# Patient Record
Sex: Male | Born: 1990 | Race: White | Hispanic: No | Marital: Single | Smoking: Current every day smoker
Health system: Southern US, Community
[De-identification: ages and names within clinical notes are randomized; demographics above are authoritative.]

## PROBLEM LIST (undated history)

## (undated) DIAGNOSIS — K469 Unspecified abdominal hernia without obstruction or gangrene: Secondary | ICD-10-CM

## (undated) HISTORY — DX: Unspecified abdominal hernia without obstruction or gangrene: K46.9

## (undated) HISTORY — PX: HERNIA REPAIR: SHX51

---
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
## 2012-04-24 ENCOUNTER — Emergency Department: Payer: Self-pay | Admitting: Emergency Medicine

## 2013-03-25 ENCOUNTER — Ambulatory Visit: Payer: Self-pay | Admitting: Family Medicine

## 2013-03-25 LAB — CBC WITH DIFFERENTIAL/PLATELET
Eosinophil %: 3.5 %
Monocyte #: 0.8 x10 3/mm (ref 0.2–1.0)
Neutrophil %: 66.3 %
Platelet: 190 10*3/uL (ref 150–440)
WBC: 11.8 10*3/uL — ABNORMAL HIGH (ref 3.8–10.6)

## 2013-03-26 ENCOUNTER — Other Ambulatory Visit: Payer: Self-pay | Admitting: Family Medicine

## 2013-03-26 LAB — CBC WITH DIFFERENTIAL/PLATELET
Basophil %: 0.6 %
Eosinophil #: 0.7 10*3/uL (ref 0.0–0.7)
Eosinophil %: 5.5 %
HGB: 17 g/dL (ref 13.0–18.0)
Lymphocyte #: 3 10*3/uL (ref 1.0–3.6)
Lymphocyte %: 25.4 %
MCV: 88 fL (ref 80–100)
Monocyte #: 0.8 x10 3/mm (ref 0.2–1.0)
WBC: 11.8 10*3/uL — ABNORMAL HIGH (ref 3.8–10.6)

## 2013-03-29 ENCOUNTER — Encounter: Payer: Self-pay | Admitting: General Surgery

## 2013-03-29 ENCOUNTER — Ambulatory Visit (INDEPENDENT_AMBULATORY_CARE_PROVIDER_SITE_OTHER): Payer: Managed Care, Other (non HMO) | Admitting: General Surgery

## 2013-03-29 VITALS — BP 144/86 | HR 74 | Resp 14 | Ht 72.0 in | Wt 249.0 lb

## 2013-03-29 DIAGNOSIS — R109 Unspecified abdominal pain: Secondary | ICD-10-CM

## 2013-03-29 NOTE — Patient Instructions (Signed)
Patient to return as needed. 

## 2013-03-29 NOTE — Progress Notes (Signed)
Patient ID: Edward Munoz, male   DOB: [birth year], 22 y.o.   MRN: [id number]  Chief Complaint  Patient presents with  . Abdominal Pain    HPI Edward Munoz is a 22 y.o. male.  Here today for evaluation of abdominal pain referred by d Huston Foley NP. CT scan and labs done. States the pain started last week. The pain is constant. The pain is described as a sharp stabbing pain at times as well as a dull ache at time. He notices a bulge in the left side of his abdomen. He noticed the bulge at the same time the pain started.  The patient reported some discomfort superior and lateral to the umbilicus, on the left side of the abdomen. He noticed thickening and the skin and tenderness to pressure. No history of trauma or unusual physical activity. No difficulty with appetite, bowel or bladder function  HPI  Past Medical History  Diagnosis Date  . Hernia     Past Surgical History  Procedure Laterality Date  . Hernia repair      4th grade inguinal    Family History  Problem Relation Age of Onset  . Cancer Mother     breast  diagnosed in 80  . Cancer Maternal Aunt     colon    Social History History  Substance Use Topics  . Smoking status: Current Every Day Smoker -- 0.25 packs/day for 1 years    Types: Cigarettes  . Smokeless tobacco: Not on file  . Alcohol Use: Yes    Allergies  Allergen Reactions  . Codeine Hives  . Penicillins Hives    Current Outpatient Prescriptions  Medication Sig Dispense Refill  . doxycycline (VIBRA-TABS) 100 MG tablet 100 mg 2 (two) times daily.       Marland Kitchen HYDROcodone-homatropine (HYCODAN) 5-1.5 MG/5ML syrup 5 mLs every 6 (six) hours as needed.        No current facility-administered medications for this visit.    Review of Systems Review of Systems  Constitutional: Negative.   Respiratory: Negative.   Cardiovascular: Negative.   Gastrointestinal: Positive for abdominal pain.    Blood pressure 144/86, pulse 74, resp. rate 14, height 6' (1.829 m),  weight 249 lb (112.946 kg).  Physical Exam Physical Exam  Constitutional: He is oriented to person, place, and time. He appears well-developed and well-nourished.  Cardiovascular: Normal rate, regular rhythm and normal heart sounds.   No murmur heard. Pulmonary/Chest: Effort normal and breath sounds normal.  Abdominal: Soft. Normal appearance and bowel sounds are normal.    4 cm area to the left superior to the umbilicus tender, faint erythema without distinct mass.   Neurological: He is alert and oriented to person, place, and time.  Skin: Skin is warm and dry.    Data Reviewed CT of the abdomen without contrast dated 03/25/2013 was reviewed. Mild thickening of the skin to the left of the umbilicus was thought to possibly represent inflammation. Fingerlike projection of peritoneal fat at the umbilical area. No other abnormalities of the subcutaneous fat. No evidence of fascial abnormality except the small defect related to the umbilicus.  CBC dated 03/25/2013 showed a hemoglobin of 18.1, blood cell count 11,800 with a normal differential. Platelet count 190,000.  Assessment    Local inflammation in the subcutaneous fat without evidence of abscess formation or significant umbilical hernia     Plan    Observation alone is warranted at this time. No indication for repair of this 5 mm fascial defect which  is nontender not clinically palpable.       Earline Mayotte 03/30/2013, 7:57 AM

## 2013-03-30 ENCOUNTER — Encounter: Payer: Self-pay | Admitting: General Surgery

## 2013-12-07 ENCOUNTER — Ambulatory Visit: Payer: Self-pay | Admitting: Family Medicine

## 2014-03-20 IMAGING — CT CT ABDOMEN W/O CM
1 of 2 series · 15 of 32 positions shown, 19 images · non-contrast
Comparison: none

REASON FOR EXAM: abd pain
COMMENTS:

[Series 2: 3mm soft tissue · axial · 0.85mm/px · z∈[-883,-568]mm · 15 of 115 slices shown, 19 images]
[im 5/115  soft-tissue]
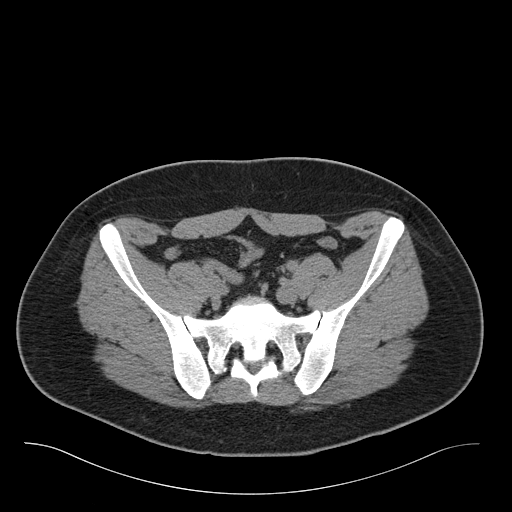
[im 5/115  bone]
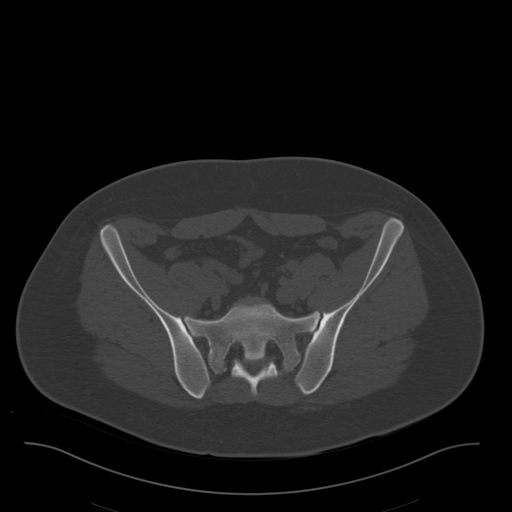
[im 15/115  soft-tissue]
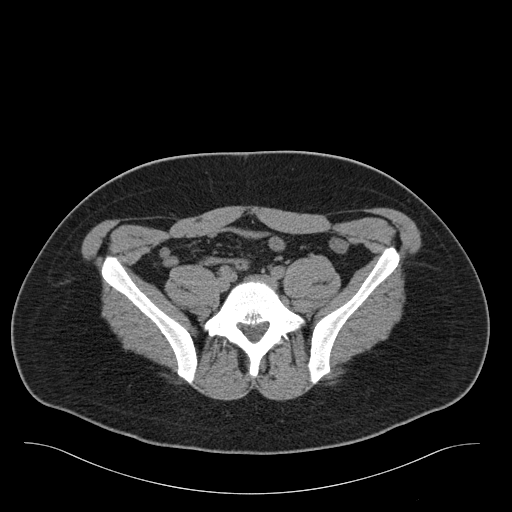
[im 24/115  soft-tissue]
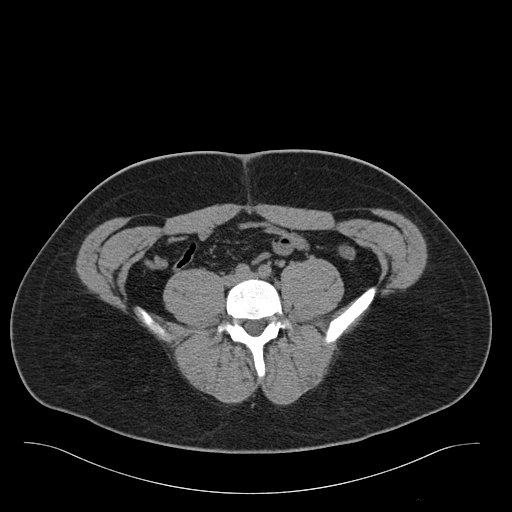
[im 34/115  soft-tissue]
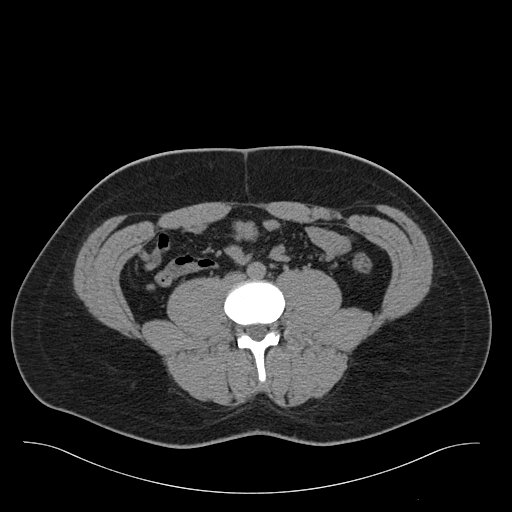
[im 39/115  soft-tissue]
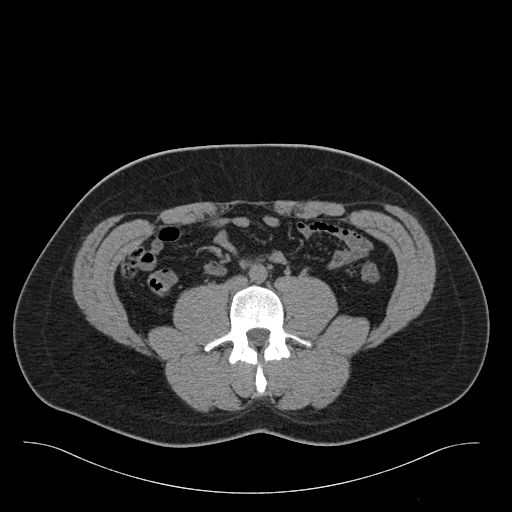
[im 48/115  soft-tissue]
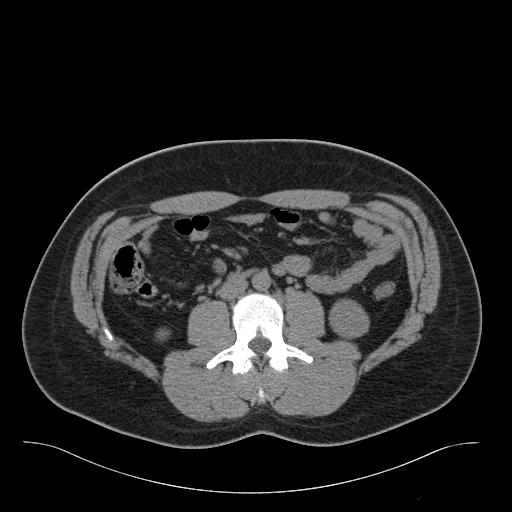
[im 58/115  soft-tissue]
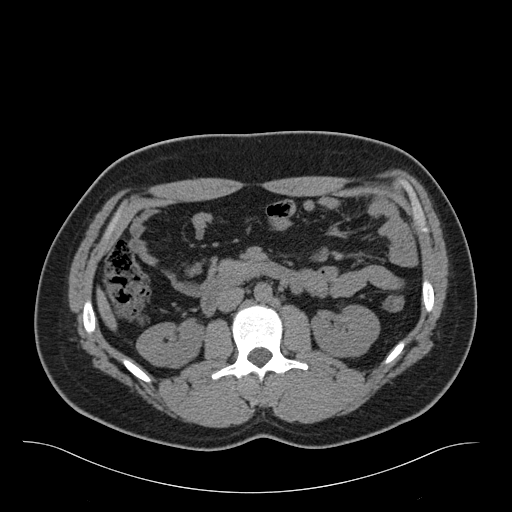
[im 67/115  soft-tissue]
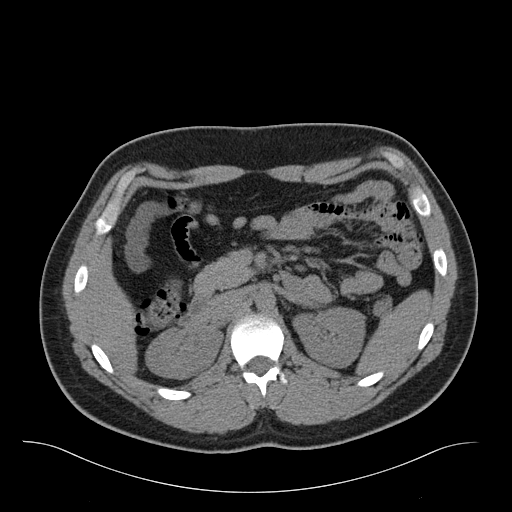
[im 77/115  soft-tissue]
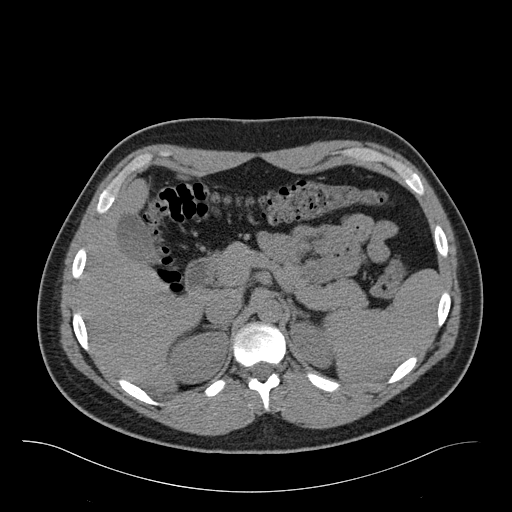
[im 77/115  bone]
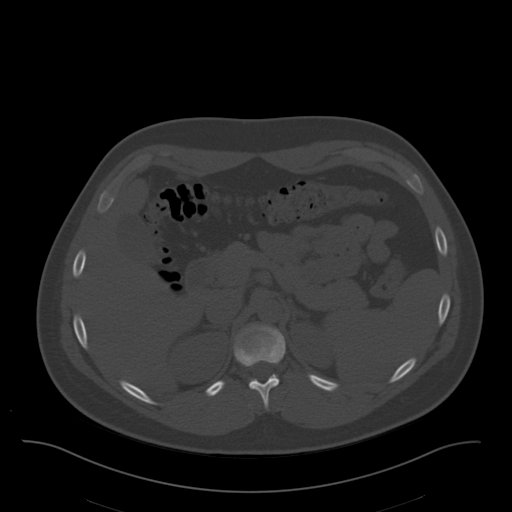
[im 81/115  soft-tissue]
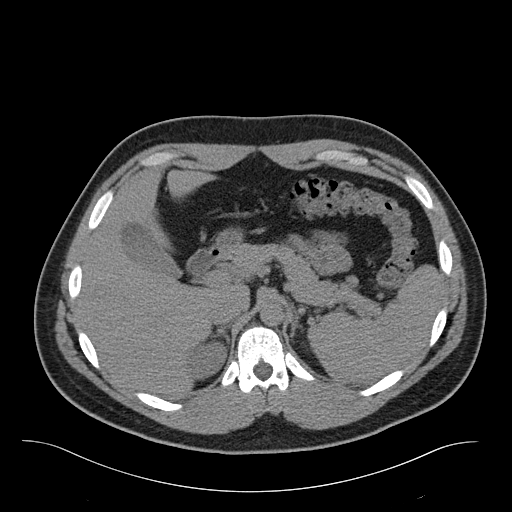
[im 91/115  soft-tissue]
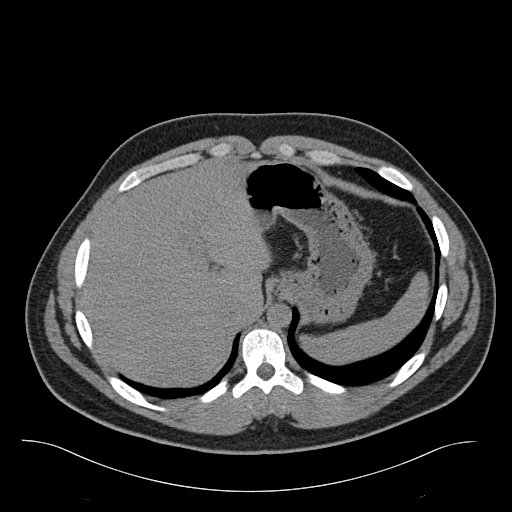
[im 96/115  lung]
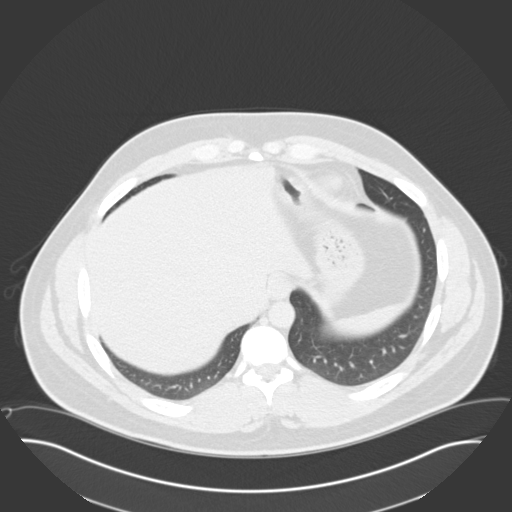
[im 100/115  soft-tissue]
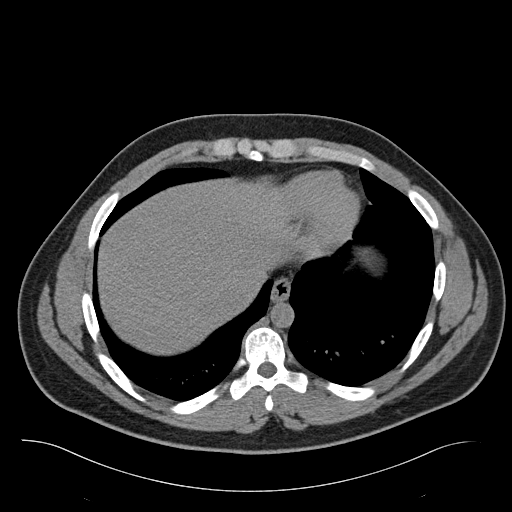
[im 100/115  lung]
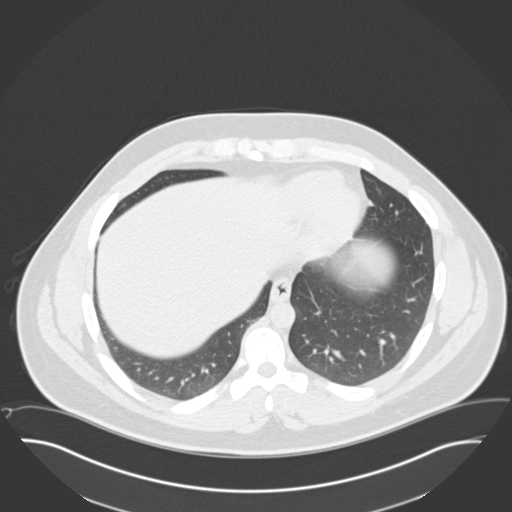
[im 105/115  lung]
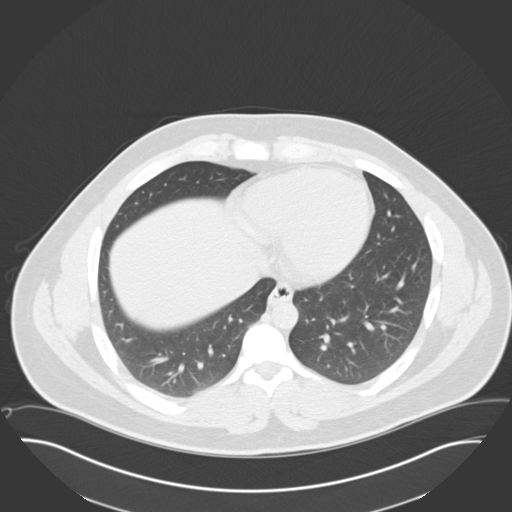
[im 110/115  soft-tissue]
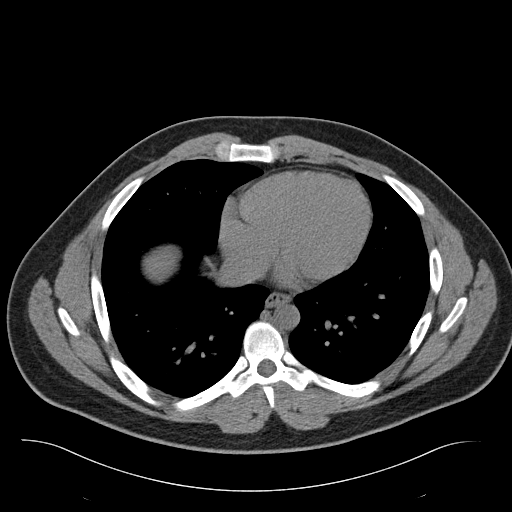
[im 110/115  lung]
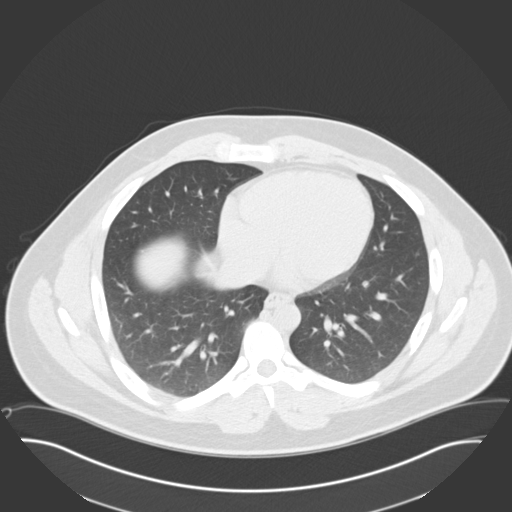

[15 of 32 positions shown; findings below may reference images not displayed]

PROCEDURE:     CT  - CT ABDOMEN STANDARD WO  - March 25, 2013  [DATE]

RESULT:     Axial noncontrast CT scanning was performed through the abdomen
with reconstructions at 3 mm intervals and slice thicknesses. Review of
multiplanar reconstructed images was performed separately on the VIA monitor.

The at umbilicus appears normal. There is no significant hernia
demonstrated. A tiny obtained are likely injection of peritoneal fat extends
to just the to the skin surface. There is mild induration of the soft
tissues of the umbilicus. There is no involvement of bowel or other
peritoneal structures.

Liver, gallbladder, spleen, adrenal glands, and kidneys are normal in
appearance. The stomach and observed portions of the small and large bowel
exhibit no acute abnormalities. There are numerous normal sized mesenteric
lymph nodes present.

The lung bases are clear. The lumbar vertebral bodies are preserved in
height.
IMPRESSION: 1. Mild thickening associated with the left aspect of the skin of the
umbilicus may reflect inflammation. There is a fingerlike projection of
peritoneal fat into the umbilicus coming to lie approximately 5 mm from the
skin surface deep in the umbilicus. There is no evidence of herniation of
bowel into the umbilicus.
2. The subcutaneous fat elsewhere in the anterior abdominal wall appears
normal.
3. No acute intra-abdominal abnormality is demonstrated.

Common the findings were discussed with this Tayser Masrawy, NP, at [DATE] p.m.
on March 25, 2013.

[REDACTED]

## 2014-12-02 IMAGING — CR DG ABDOMEN 1V
1 series · 3 of 3 positions shown · non-contrast
Comparison: None.

CLINICAL DATA: Abdominal pain.

EXAM:
ABDOMEN - 1 VIEW

[Series 1: supine kub · 0.17mm/px · 3 of 3 slices shown]
[im 1/3]
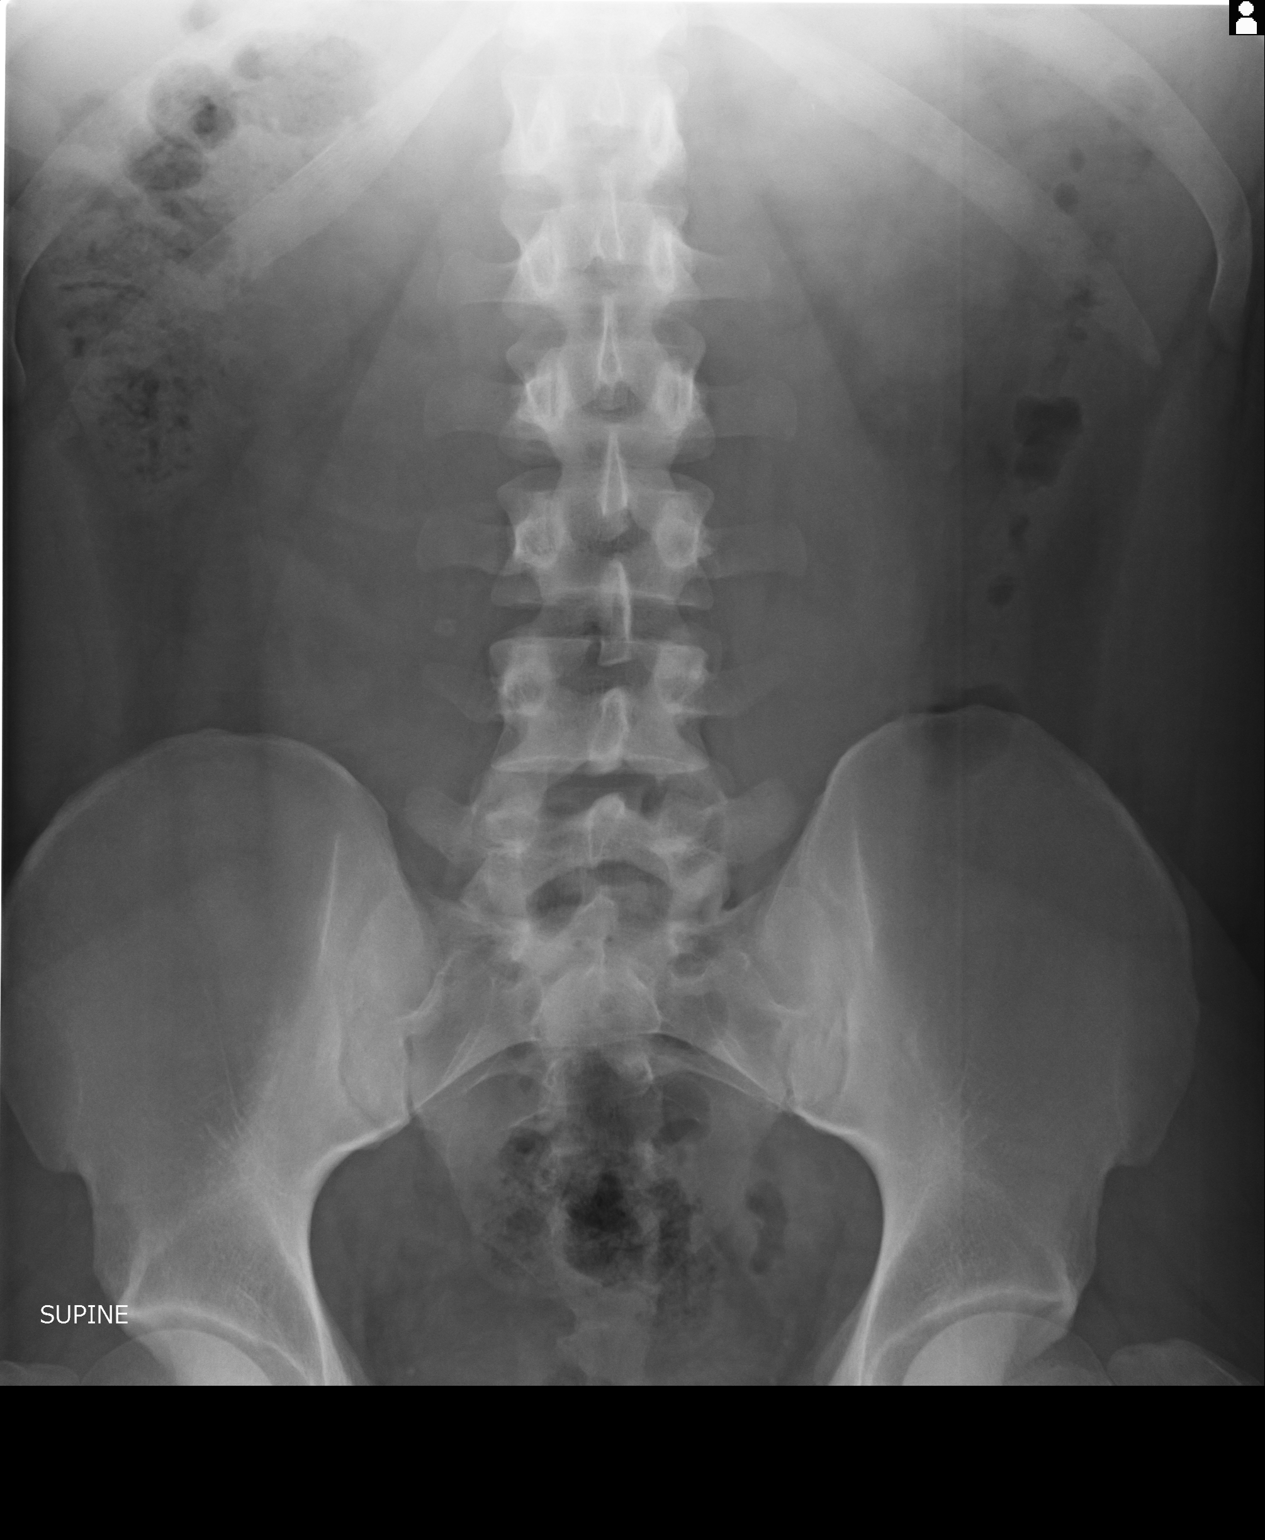
[im 2/3]
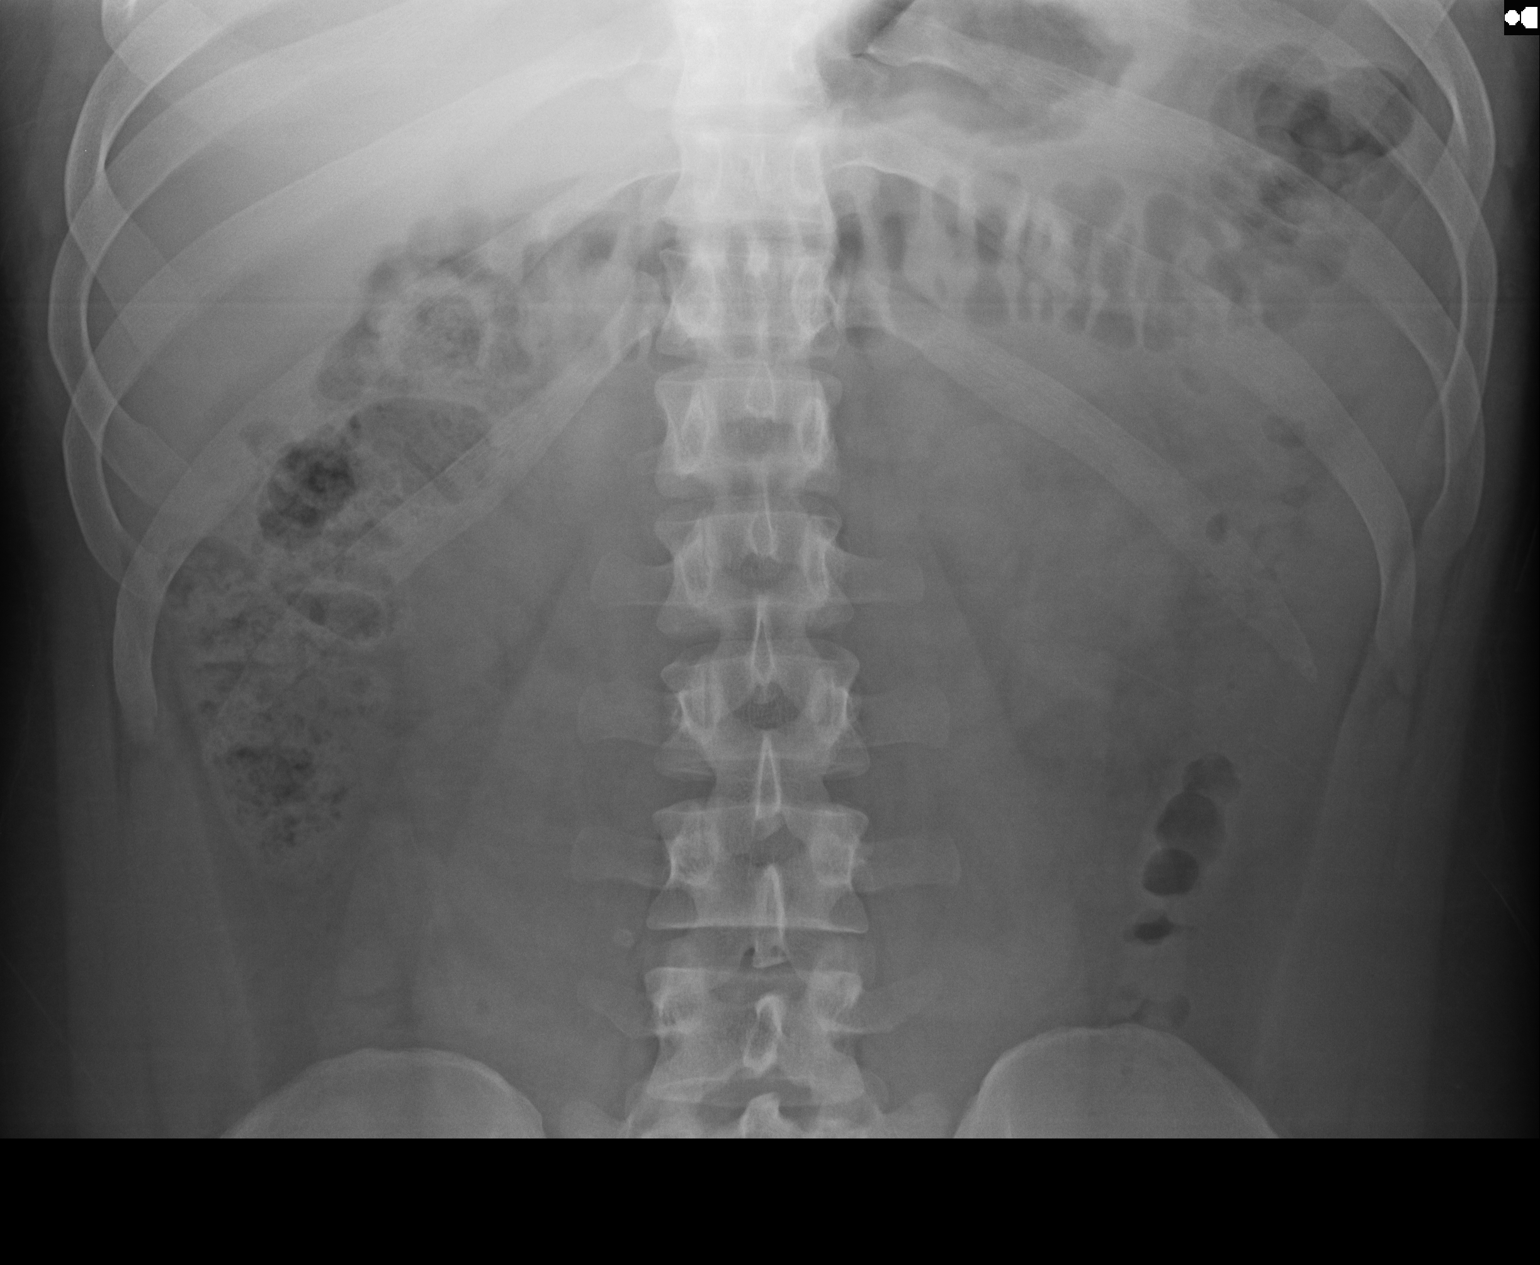
[im 3/3]
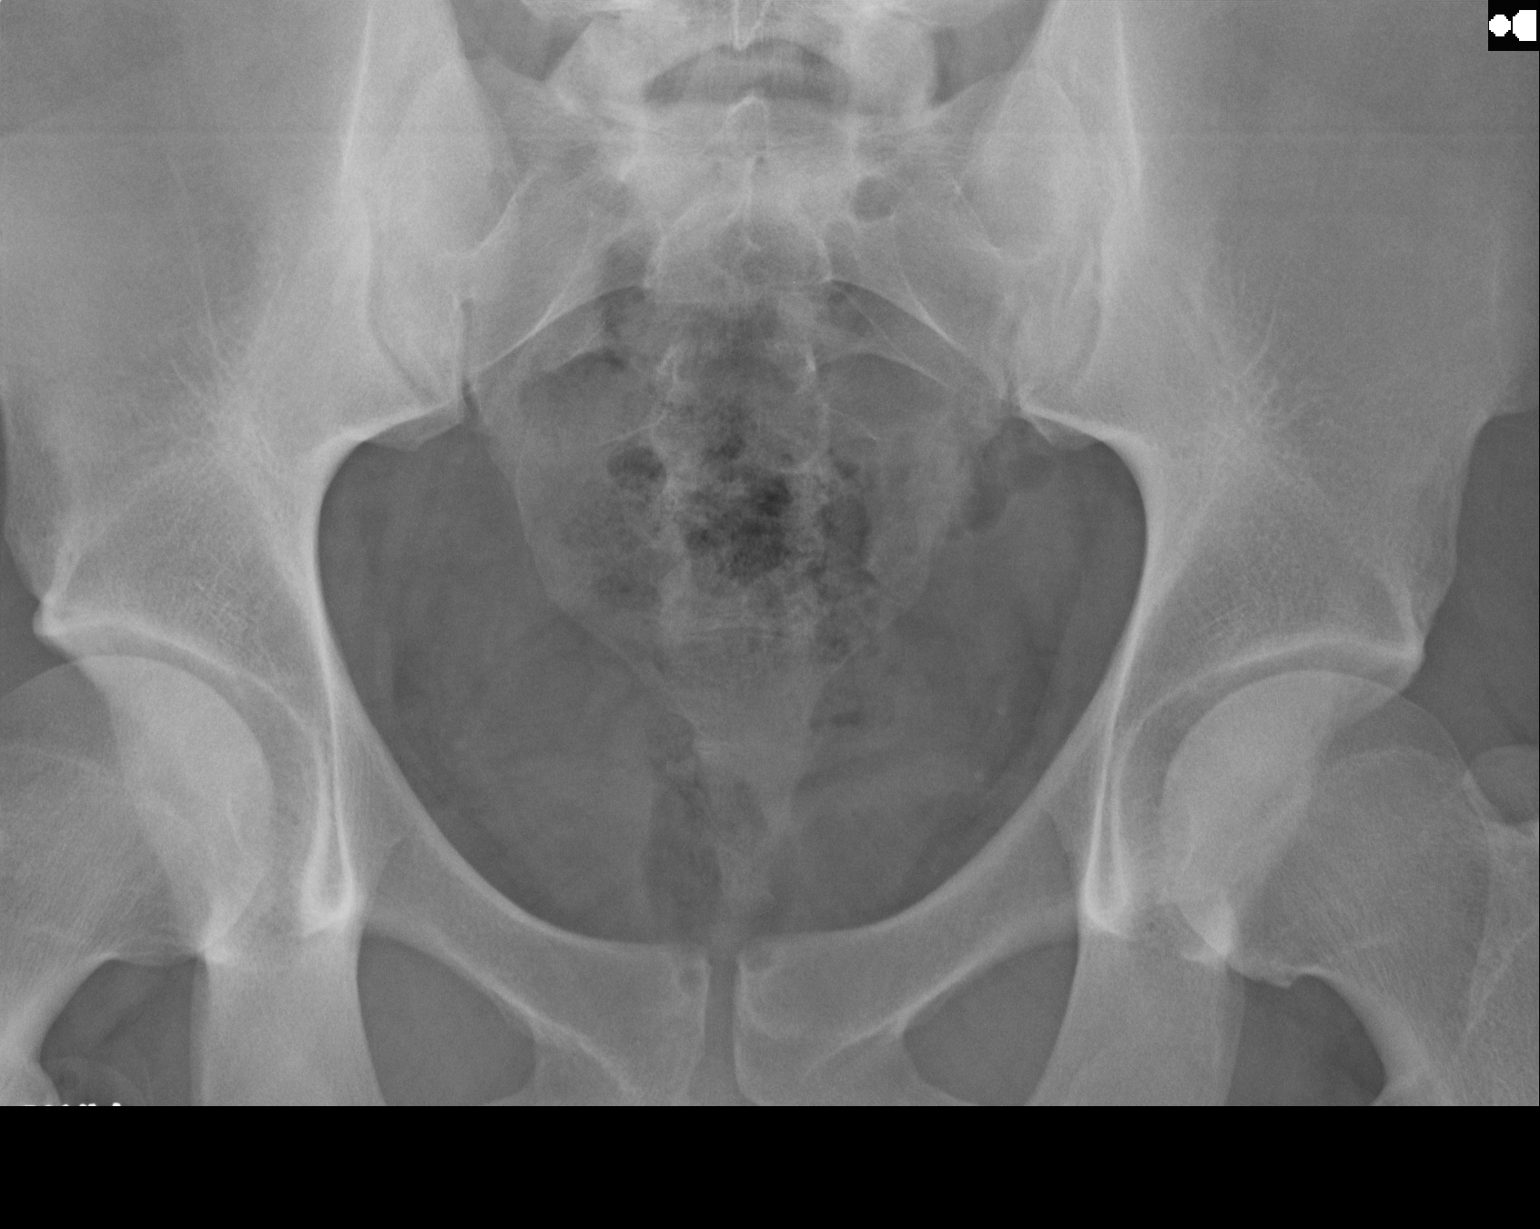

[3 of 3 positions shown; findings below may reference images not displayed]

FINDINGS: The bowel gas pattern is normal. There is noted a rounded
calcification to the right of the L3-4 disc space concerning for
ureteral calculus. Small amount of stool is noted in the right colon
and rectum.
IMPRESSION: No evidence of bowel obstruction or ileus. Rounded calcification
noted to the right of L3-4 disc space concerning for ureteral
calculus.

## 2015-01-09 ENCOUNTER — Ambulatory Visit (INDEPENDENT_AMBULATORY_CARE_PROVIDER_SITE_OTHER): Payer: Managed Care, Other (non HMO) | Admitting: Family Medicine

## 2015-01-09 ENCOUNTER — Encounter: Payer: Self-pay | Admitting: Family Medicine

## 2015-01-09 VITALS — BP 108/62 | HR 84 | Temp 99.4°F | Resp 20 | Ht 71.0 in | Wt 247.0 lb

## 2015-01-09 DIAGNOSIS — L03316 Cellulitis of umbilicus: Secondary | ICD-10-CM | POA: Diagnosis not present

## 2015-01-09 DIAGNOSIS — Z720 Tobacco use: Secondary | ICD-10-CM | POA: Diagnosis not present

## 2015-01-09 DIAGNOSIS — J40 Bronchitis, not specified as acute or chronic: Secondary | ICD-10-CM | POA: Diagnosis not present

## 2015-01-09 MED ORDER — DOXYCYCLINE HYCLATE 100 MG PO TABS: 100.0000 mg | ORAL_TABLET | Freq: Two times a day (BID) | ORAL | Status: DC

## 2015-01-09 MED ORDER — AZITHROMYCIN 250 MG PO TABS: ORAL_TABLET | ORAL | Status: DC

## 2015-01-09 MED ORDER — NYSTATIN 100000 UNIT/GM EX OINT: 1.0000 "application " | TOPICAL_OINTMENT | Freq: Two times a day (BID) | CUTANEOUS | Status: DC

## 2015-01-09 NOTE — Progress Notes (Signed)
Patient ID: Edward Munoz, male   DOB: 10/08/1990, 24 y.o.   MRN: [id number]       Patient: Edward Munoz Male    DOB: 10/08/1990   23 y.o.   MRN: [id number] Visit Date: 01/09/2015  Today's Provider: Lorie PhenixNancy Selmer Adduci, MD   Chief Complaint  Patient presents with  . Cough    x 4 days   . Emesis    X 4 days    Subjective:    Cough The current episode started in the past 7 days. The problem has been gradually worsening. The problem occurs constantly. The cough is productive of sputum. Associated symptoms include chest pain, headaches, nasal congestion, postnasal drip, rhinorrhea, a sore throat, shortness of breath and wheezing. Pertinent negatives include no fever. Nothing aggravates the symptoms. He has tried OTC cough suppressant for the symptoms. The treatment provided no relief. There is no history of asthma.  Emesis  This is a new problem. The current episode started in the past 7 days. The problem occurs intermittently. The problem has been gradually improving. There has been no fever. Associated symptoms include abdominal pain, chest pain, coughing and headaches. Pertinent negatives include no diarrhea or fever. He has tried nothing for the symptoms.       Allergies  Allergen Reactions  . Codeine Hives  . Penicillins Hives   Previous Medications   No medications on file  IN 2014    Review of Systems  Constitutional: Negative for fever.  HENT: Positive for postnasal drip, rhinorrhea and sore throat.   Respiratory: Positive for cough, shortness of breath and wheezing.   Cardiovascular: Positive for chest pain.  Gastrointestinal: Positive for vomiting and abdominal pain. Negative for diarrhea.  Neurological: Positive for headaches.    History  Substance Use Topics  . Smoking status: Current Every Day Smoker -- 0.25 packs/day for 1 years    Types: Cigarettes  . Smokeless tobacco: Not on file  . Alcohol Use: Yes   Objective:   BP 108/62 mmHg  Pulse 84  Temp(Src) 99.4  F (37.4 C)  Resp 20  Ht 5\' 11"  (1.803 m)  Wt 247 lb (112.038 kg)  BMI 34.46 kg/m2  SpO2 96%  Physical Exam  Constitutional: He appears well-developed and well-nourished.  HENT:  Head: Normocephalic and atraumatic.  Right Ear: External ear normal.  Left Ear: External ear normal.  Nose: Nose normal.  Mouth/Throat: Oropharynx is clear and moist.  Neck: Normal range of motion. Neck supple.  Cardiovascular: Normal rate and regular rhythm.   Pulmonary/Chest: Effort normal and breath sounds normal.  Abdominal: Soft. Bowel sounds are normal.  Umbilicus with erythema and discharge.        Assessment & Plan:      1. Bronchitis - doxycycline (VIBRA-TABS) 100 MG tablet; Take 1 tablet (100 mg total) by mouth 2 (two) times daily. Cancel Zpak.  Dispense: 14 tablet; Refill: 0  2. Tobacco use- Encouraged cessation.   3. Cellulitis of umbilicus Worsening. Will treat as tinea and possible cellulitis.  Please call back if condition worsens or does not continue to improve.    - nystatin ointment (MYCOSTATIN); Apply 1 application topically 2 (two) times daily.  Dispense: 30 g; Refill: 0  Lorie PhenixNancy Joannah Gitlin, MD         Lorie PhenixNancy Derrico Zhong, MD  [facility]

## 2016-01-20 ENCOUNTER — Ambulatory Visit (INDEPENDENT_AMBULATORY_CARE_PROVIDER_SITE_OTHER): Payer: Managed Care, Other (non HMO) | Admitting: Family Medicine

## 2016-01-20 ENCOUNTER — Encounter: Payer: Self-pay | Admitting: *Deleted

## 2016-01-20 ENCOUNTER — Encounter: Payer: Self-pay | Admitting: Family Medicine

## 2016-01-20 ENCOUNTER — Encounter: Payer: Managed Care, Other (non HMO) | Admitting: Family Medicine

## 2016-01-20 VITALS — BP 118/80 | HR 80 | Temp 99.1°F | Resp 16 | Ht 70.0 in | Wt 244.0 lb

## 2016-01-20 DIAGNOSIS — Z0000 Encounter for general adult medical examination without abnormal findings: Secondary | ICD-10-CM | POA: Diagnosis not present

## 2016-01-20 DIAGNOSIS — R220 Localized swelling, mass and lump, head: Secondary | ICD-10-CM | POA: Diagnosis not present

## 2016-01-20 DIAGNOSIS — N644 Mastodynia: Secondary | ICD-10-CM

## 2016-01-20 DIAGNOSIS — Z23 Encounter for immunization: Secondary | ICD-10-CM | POA: Diagnosis not present

## 2016-01-20 DIAGNOSIS — R1032 Left lower quadrant pain: Secondary | ICD-10-CM | POA: Diagnosis not present

## 2016-01-20 LAB — POCT URINALYSIS DIPSTICK
Blood, UA: NEGATIVE
GLUCOSE UA: NEGATIVE
Ketones, UA: NEGATIVE
LEUKOCYTES UA: NEGATIVE
NITRITE UA: NEGATIVE
Spec Grav, UA: 1.02
UROBILINOGEN UA: 1
pH, UA: 6

## 2016-01-20 NOTE — Patient Instructions (Addendum)
We will call you with the lab results and the referral. Encourage updating with the dentist and eye doctor.

## 2016-01-20 NOTE — Progress Notes (Signed)
Subjective:     Patient ID: Edward Munoz, male   DOB: Apr 04, 1991, 25 y.o.   MRN: [id number]  HPI  Chief Complaint  Patient presents with  . Annual Exam    Patient comes in office today for his annual physical, patient would like to address two matters today one is hernia that he would like a referral to see surgeon, other issue patient would like to address is a lump on the left side of his breast that has been present for more than 2 months. Patient states that he has sharp pain in his chest intermittent since he noticed lump, patient denies pain when touching area. Patient is due today to recieve Tdap  vaccine, last reportd TD was 01/20/05.  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 This job also involves lifting. No regular exercise reported. Mother deceased this Odis Luster from metastatic breast cancer and he is concerned about hidden illness in himself. Smokes 0.5 ppd and consumes 2 beers a day with up to 6 on a weekend.   Review of Systems General: Nagging areas of tenderness in his left breast area where he believes he sees a lump and left periumbilical area (surgical evaluation with CT scan in 2014 with no significant defect) HEENT: No regular dental visits or eye exams though wears glasses. Cardiovascular: no chest pain except as above, no shortness of breath, or palpitations GI: no heartburn, no change in bowel habits  GU: nocturia x0 no change in bladder habits. Does feel intermittent left groin pain with lifting and during intercourse.  Psychiatric: not depressed     Objective:   Physical Exam  Constitutional: He appears well-developed and well-nourished.  Eyes: PERRLA Neck: no thyromegaly, tenderness or nodules, no cervical adenopathy ENT: TM's intact without inflammation; No tonsillar enlargement or exudate, Lungs: Clear Heart : RRR without murmur or gallop Chest: tender in his left breast without specific mass palpated. Abd: bowel sounds  present, soft, non-tender (except as below)no organomegaly Extremities: no edema,  Skin: Quarter size area of tenderness without mass effect or hernia in left periumbilical area at same location previously evaluated.     Assessment:    1. Need for diphtheria-tetanus-pertussis (Tdap) vaccine - Tdap vaccine greater than or equal to 7yo IM  2. Annual physical exam - Comprehensive metabolic panel - Lipid panel  3. Breast pain, left - Ambulatory referral to General Surgery  4. Facial swelling - T4, free - TSH  5. Groin pain, left - POCT urinalysis dipstick      Plan:    Further evaluation pending lab review.

## 2016-02-12 ENCOUNTER — Ambulatory Visit: Payer: Managed Care, Other (non HMO) | Admitting: General Surgery

## 2016-03-10 ENCOUNTER — Encounter: Payer: Self-pay | Admitting: *Deleted

## 2017-07-26 ENCOUNTER — Encounter: Payer: Self-pay | Admitting: Family Medicine

## 2017-07-26 ENCOUNTER — Ambulatory Visit: Payer: 59 | Admitting: Family Medicine

## 2017-07-26 VITALS — BP 120/86 | HR 80 | Temp 98.6°F | Resp 18 | Wt 256.0 lb

## 2017-07-26 DIAGNOSIS — J40 Bronchitis, not specified as acute or chronic: Secondary | ICD-10-CM

## 2017-07-26 DIAGNOSIS — R05 Cough: Secondary | ICD-10-CM

## 2017-07-26 DIAGNOSIS — J3089 Other allergic rhinitis: Secondary | ICD-10-CM

## 2017-07-26 DIAGNOSIS — R059 Cough, unspecified: Secondary | ICD-10-CM

## 2017-07-26 MED ORDER — DOXYCYCLINE HYCLATE 100 MG PO TABS: 100.0000 mg | ORAL_TABLET | Freq: Two times a day (BID) | ORAL | 0 refills | Status: DC

## 2017-07-26 MED ORDER — MONTELUKAST SODIUM 10 MG PO TABS: 10.0000 mg | ORAL_TABLET | Freq: Every day | ORAL | 1 refills | Status: DC

## 2017-07-26 NOTE — Patient Instructions (Signed)
   Start taking OTC cetirizine (Zyrtec) 10mg  once a day every day for the next month, then as needed   Start taking prescription montelukast 10mg  once a day every day for the next month, then as needed   Call to schedule  additional test if your cough is not much better in 2 weeks.

## 2017-07-26 NOTE — Progress Notes (Signed)
Patient: Edward Munoz Male    DOB: June 06, 1991   26 y.o.   MRN: [id number] Visit Date: 07/26/2017  Today's Provider: Mila Merryonald Elston Aldape, MD   Chief Complaint  Patient presents with  . Cough    x 3 months   Subjective:    Cough  This is a new problem. Episode onset: 3 months ago. The problem has been gradually worsening. The cough is productive of sputum (green colored). Associated symptoms include chills, ear congestion, ear pain (both ears), nasal congestion, postnasal drip, rhinorrhea, a sore throat and sweats. Pertinent negatives include no chest pain, fever, headaches, hemoptysis, myalgias, shortness of breath or wheezing. Treatments tried: NyQuil , DayQuil, Muciex. The treatment provided mild relief.  Patient states the coughing spells cause him to vomit. He also reports his lymph glands are swollen in his neck.Marland Kitchen. He did get a new dog that had Kennel cough a few weeks ago, but patient's cough started a few months before that. No new chemical exposures. Cough is the same at work and at home. He did have asthma when he was a child and taking Singulair and Zyrtec until he was in middle school. He states he has not smoked cigarettes for about 3 weeks.      Allergies  Allergen Reactions  . Codeine Hives  . Penicillins Hives    No current outpatient medications on file.  Review of Systems  Constitutional: Positive for chills, diaphoresis and fatigue. Negative for appetite change and fever.  HENT: Positive for congestion, ear pain (both ears), postnasal drip, rhinorrhea, sinus pressure, sore throat and voice change. Negative for mouth sores, nosebleeds and sneezing.   Respiratory: Positive for cough. Negative for hemoptysis, chest tightness, shortness of breath and wheezing.   Cardiovascular: Negative for chest pain and palpitations.  Gastrointestinal: Negative for abdominal pain, nausea and vomiting.  Musculoskeletal: Negative for myalgias.  Neurological: Negative for headaches.     Social History   Tobacco Use  . Smoking status: Current Every Day Smoker    Packs/day: 0.25    Years: 1.00    Pack years: 0.25    Types: Cigarettes  . Smokeless tobacco: Never Used  . Tobacco comment: has not smoked in the past 3 months  Substance Use Topics  . Alcohol use: Yes   Objective:   BP 120/86 (BP Location: Left Arm, Patient Position: Sitting, Cuff Size: Large)   Pulse 80   Temp 98.6 F (37 C) (Oral)   Resp 18   Wt 256 lb (116.1 kg)   SpO2 97% Comment: room air  BMI 36.73 kg/m  There were no vitals filed for this visit.   Physical Exam  General Appearance:    Alert, cooperative, no distress  HENT:   bilateral TM normal without fluid or infection, neck has bilateral anterior cervical nodes enlarged, throat normal without erythema or exudate, sinuses nontender and nasal mucosa congested  Eyes:    PERRL, conjunctiva/corneas clear, EOM's intact       Lungs:     Occasional expiratory wheeze, no rales,  respirations unlabored  Heart:    Regular rate and rhythm  Neurologic:   Awake, alert, oriented x 3. No apparent focal neurological           defect.           Assessment & Plan:     1. Cough Multifactorial, probably mildly asthma exacerbation with bronchitis.   2. Bronchitis Doxycycline 100 twice daily for 10 days.  3. Allergic rhinitis due to other allergic trigger, unspecified seasonality Probably lower airway inflammation as well. Start on singular and zyrtec x 1 months.   Call if symptoms change or if not rapidly improving.           Mila Merry, MD  [facility]

## 2018-01-19 NOTE — Progress Notes (Deleted)
       Patient: Edward Munoz Male    DOB: [birth year]   26 y.o.   MRN: [id number] Visit Date: 01/19/2018  Today's Provider: Mila Merryonald Fisher, MD   No chief complaint on file.  Subjective:    Emesis   Pertinent negatives include no abdominal pain, chest pain, chills or fever.       Allergies  Allergen Reactions  . Codeine Hives  . Penicillins Hives     Current Outpatient Medications:  .  doxycycline (VIBRA-TABS) 100 MG tablet, Take 1 tablet (100 mg total) by mouth 2 (two) times daily., Disp: 20 tablet, Rfl: 0 .  montelukast (SINGULAIR) 10 MG tablet, Take 1 tablet (10 mg total) by mouth at bedtime., Disp: 30 tablet, Rfl: 1  Review of Systems  Constitutional: Negative for appetite change, chills and fever.  Respiratory: Negative for chest tightness, shortness of breath and wheezing.   Cardiovascular: Negative for chest pain and palpitations.  Gastrointestinal: Positive for nausea and vomiting. Negative for abdominal pain.    Social History   Tobacco Use  . Smoking status: Current Every Day Smoker    Packs/day: 0.25    Years: 1.00    Pack years: 0.25    Types: Cigarettes  . Smokeless tobacco: Never Used  . Tobacco comment: has not smoked in the past 3 months  Substance Use Topics  . Alcohol use: Yes   Objective:   There were no vitals taken for this visit. There were no vitals filed for this visit.   Physical Exam      Assessment & Plan:           Mila Merryonald Fisher, MD  [facility]

## 2018-01-20 ENCOUNTER — Ambulatory Visit: Payer: Self-pay | Admitting: Family Medicine

## 2018-06-28 ENCOUNTER — Other Ambulatory Visit: Payer: Self-pay | Admitting: Orthopaedic Surgery

## 2018-06-28 ENCOUNTER — Ambulatory Visit
Admission: RE | Admit: 2018-06-28 | Discharge: 2018-06-28 | Disposition: A | Discharge status: Home / Self Care | Payer: 59 | Source: Ambulatory Visit | Attending: Orthopaedic Surgery | Admitting: Orthopaedic Surgery

## 2018-06-28 DIAGNOSIS — S60450A Superficial foreign body of right index finger, initial encounter: Secondary | ICD-10-CM | POA: Diagnosis not present

## 2018-08-09 ENCOUNTER — Encounter: Payer: Self-pay | Admitting: Family Medicine

## 2018-08-09 ENCOUNTER — Other Ambulatory Visit: Payer: Self-pay | Admitting: Family Medicine

## 2018-08-09 ENCOUNTER — Ambulatory Visit: Payer: 59 | Admitting: Family Medicine

## 2018-08-09 VITALS — BP 130/96 | HR 125 | Temp 99.4°F | Resp 18 | Wt 245.6 lb

## 2018-08-09 DIAGNOSIS — K529 Noninfective gastroenteritis and colitis, unspecified: Secondary | ICD-10-CM | POA: Diagnosis not present

## 2018-08-09 MED ORDER — ONDANSETRON HCL 8 MG PO TABS: 8.0000 mg | ORAL_TABLET | Freq: Three times a day (TID) | ORAL | 0 refills | Status: DC | PRN

## 2018-08-09 NOTE — Progress Notes (Signed)
  Subjective:     Patient ID: Edward Munoz, male   DOB: 1990/09/08, 28 y.o.   MRN: [id number] Chief Complaint  Patient presents with  . Viral Symptoms    Patient comes in office todayw ith concerns of viral like symptoms over a period of 24hrs. Patient states that yesterday evening he began having the following symptoms; vomiting, diarrhea, chills/sweats and muscle aches. Patient denies any changes in his diet or eating out at fastfood/restaurant. Patient states that he did travel over weekend to beach, patient reports that he has taken otc Advil and a old antibiotic previously prescribed to treat another illness.   . Skin Problem    Patient reports that he has had a lump on the left side of his chest for over a year and in the past several months has noticed that area in question has grown in size and is painful to the touch. Patient states that he has seen specialist before in past and states that he was told that it was a collection of fatty like tissue.    HPI States vomiting has been improving but continues to have frequent watery stools. Dad accompanies and is not ill. 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  Review of Systems     Objective:   Physical Exam Constitutional:      General: He is not in acute distress.    Appearance: He is ill-appearing.  Abdominal:     Palpations: Abdomen is soft.     Tenderness: There is no abdominal tenderness.  Skin:    Comments: I don't palpate a significant mass in his left chest area.   Neurological:     Mental Status: He is alert.        Assessment:    1. Gastroenteritis presumed infectious - ondansetron (ZOFRAN) 8 MG tablet; Take 1 tablet (8 mg total) by mouth every 8 (eight) hours as needed for nausea or vomiting.  Dispense: 12 tablet; Refill: 0    Plan:    Discussed use of Gatorade sips. Work excuse for 2/18-2/21/2020

## 2018-08-09 NOTE — Patient Instructions (Signed)
Discussed use of imodium for diarrhea and Gatorade for fluid intake. Eat as tolerated. Call me for referral to dermatology if you are worried about your chest lipoma.

## 2018-09-15 ENCOUNTER — Ambulatory Visit: Payer: Self-pay | Admitting: Physician Assistant

## 2018-09-16 ENCOUNTER — Ambulatory Visit: Payer: 59 | Admitting: Physician Assistant

## 2018-09-16 ENCOUNTER — Other Ambulatory Visit: Payer: Self-pay

## 2018-09-16 ENCOUNTER — Encounter: Payer: Self-pay | Admitting: Physician Assistant

## 2018-09-16 VITALS — BP 132/71 | HR 84 | Temp 98.0°F | Resp 16 | Wt 255.0 lb

## 2018-09-16 DIAGNOSIS — R1084 Generalized abdominal pain: Secondary | ICD-10-CM | POA: Diagnosis not present

## 2018-09-16 NOTE — Patient Instructions (Signed)
Hernia, Adult         A hernia is the bulging of an organ or tissue through a weak spot in the muscles of the abdomen (abdominal wall). Hernias develop most often near the belly button (navel) or the area where the leg meets the lower abdomen (groin).  Common types of hernias include:  · Incisional hernia. This type bulges through a scar from an abdominal surgery.  · Umbilical hernia. This type develops near the navel.  · Inguinal hernia. This type develops in the groin or scrotum.  · Femoral hernia. This type develops under the groin, in the upper thigh area.  · Hiatal hernia. This type occurs when part of the stomach slides above the muscle that separates the abdomen from the chest (diaphragm).  What are the causes?  This condition may be caused by:  · Heavy lifting.  · Coughing over a long period of time.  · Straining to have a bowel movement. Constipation can lead to straining.  · An incision made during an abdominal surgery.  · A physical problem that is present at birth (congenital defect).  · Being overweight or obese.  · Smoking.  · Excess fluid in the abdomen.  · Undescended testicles in males.  What are the signs or symptoms?  The main symptom is a skin-colored, rounded bulge in the area of the hernia. However, a bulge may not always be present. It may grow bigger or be more visible when you cough or strain (such as when lifting something heavy).  A hernia that can be pushed back into the area (is reducible) rarely causes pain. A hernia that cannot be pushed back into the area (is incarcerated) may lose its blood supply (become strangulated). A hernia that is incarcerated may cause:  · Pain.  · Fever.  · Nausea and vomiting.  · Swelling.  · Constipation.  How is this diagnosed?  A hernia may be diagnosed based on:  · Your symptoms and medical history.  · A physical exam. Your health care provider may ask you to cough or move in certain ways to see if the hernia becomes visible.  · Imaging tests, such  as:  ? X-rays.  ? Ultrasound.  ? CT scan.  How is this treated?  A hernia that is small and painless may not need to be treated. A hernia that is large or painful may be treated with surgery. Inguinal hernias may be treated with surgery to prevent incarceration or strangulation. Strangulated hernias are always treated with surgery because a lack of blood supply to the trapped organ or tissue can cause it to die.  Surgery to treat a hernia involves pushing the bulge back into place and repairing the weak area of the muscle or abdominal wall.  Follow these instructions at home:  Activity  · Avoid straining.  · Do not lift anything that is heavier than 10 lb (4.5 kg), or the limit that you are told, until your health care provider says that it is safe.  · When lifting heavy objects, lift with your leg muscles, not your back muscles.  Preventing constipation  · Take actions to prevent constipation. Constipation leads to straining with bowel movements, which can make a hernia worse or cause a hernia repair to break down. Your health care provider may recommend that you:  ? Drink enough fluid to keep your urine pale yellow.  ? Eat foods that are high in fiber, such as fresh fruits and vegetables, whole   grains, and beans.  ? Limit foods that are high in fat and processed sugars, such as fried or sweet foods.  ? Take an over-the-counter or prescription medicine for constipation.  General instructions  · When coughing, try to cough gently.  · You may try to push the hernia back in place by very gently pressing on it while lying down. Do not try to force the bulge back in if it will not push in easily.  · If you are overweight, work with your health care provider to lose weight safely.  · Do not use any products that contain nicotine or tobacco, such as cigarettes and e-cigarettes. If you need help quitting, ask your health care provider.  · If you are scheduled for hernia repair, watch your hernia for any changes in shape,  size, or color. Tell your health care provider about any changes or new symptoms.  · Take over-the-counter and prescription medicines only as told by your health care provider.  · Keep all follow-up visits as told by your health care provider. This is important.  Contact a health care provider if:  · You develop new pain, swelling, or redness around your hernia.  · You have signs of constipation, such as:  ? Fewer bowel movements in a week than normal.  ? Difficulty having a bowel movement.  ? Stools that are dry, hard, or larger than normal.  Get help right away if:  · You have a fever.  · You have abdomen pain that gets worse.  · You feel nauseous or you vomit.  · You cannot push the hernia back in place by very gently pressing on it while lying down. Do not try to force the bulge back in if it will not push in easily.  · The hernia:  ? Changes in shape, size, or color.  ? Feels hard or tender.  These symptoms may represent a serious problem that is an emergency. Do not wait to see if the symptoms will go away. Get medical help right away. Call your local emergency services (911 in the [place]  Summary  · A hernia is the bulging of an organ or tissue through a weak spot in the muscles of the abdomen (abdominal wall).  · The main symptom is a skin-colored, rounded lump (bulge) in the hernia area. However, a bulge may not always be present. It may grow bigger or more visible when you cough or strain (such as when having a bowel movement).  · A hernia that is small and painless may not need to be treated. A hernia that is large or painful may be treated with surgery.  · Surgery to treat a hernia involves pushing the bulge back into place and repairing the weak part of the abdomen.  This information is not intended to replace advice given to you by your health care provider. Make sure you discuss any questions you have with your health care provider.  Document Released: 06/08/2005 Document Revised: 03/10/2017 Document  Reviewed: 03/10/2017  Elsevier Interactive Patient Education © 2019 Elsevier Inc.

## 2018-09-16 NOTE — Progress Notes (Signed)
Patient: Edward Munoz Male    DOB: Apr 19, 1991   28 y.o.   MRN: [id number] Visit Date: 09/16/2018  Today's Provider: Trey Sailors, PA-C   Chief Complaint  Patient presents with  . Hernia   Subjective:     HPI  Patient here with c/o hernia pain made worse with straining. Patient was evaluated for hernia by Dr. Lemar Livings in 2014 and CT abdomen pelvis at that time showed "a fingerlike projection of peritoneal fat into the umbilicus coming to lie approximately 5 mmg from the skin surface deep in the umbilicus. There is no evidence of herniation of bowel into the umbilicus."  Patient reports that pain is located in the middle of his stomach. Reports that the pain radiating to his back and is giving him indigestion when he lays down and SOB from it.Reports that it hurts when he press on it. Has not taken anything for the pain. No fever. He reports he feels a visible bulge when he stands up and strains, and also reports he can see this. He also reports a lump/bulge in his left pectoral region that he is worried is his hernia.   He reports he is still having bowel movements and passing gas. He reports he vomited twice stomach contents but has been able to keep food down otherwise. He does not have fevers or chills.    Allergies  Allergen Reactions  . Codeine Hives  . Penicillins Hives     Current Outpatient Medications:  .  ondansetron (ZOFRAN) 8 MG tablet, Take 1 tablet (8 mg total) by mouth every 8 (eight) hours as needed for nausea or vomiting. (Patient not taking: Reported on 09/16/2018), Disp: 12 tablet, Rfl: 0  Review of Systems  ROS negative except for HPI.   Social History   Tobacco Use  . Smoking status: Current Every Day Smoker    Packs/day: 0.25    Years: 1.00    Pack years: 0.25    Types: Cigarettes  . Smokeless tobacco: Never Used  . Tobacco comment: has not smoked in the past 3 months  Substance Use Topics  . Alcohol use: Yes      Objective:   BP  132/71 (BP Location: Left Arm, Patient Position: Sitting, Cuff Size: Large)   Pulse 84   Temp 98 F (36.7 C) (Oral)   Resp 16   Wt 255 lb (115.7 kg)   SpO2 98%   BMI 36.59 kg/m  Vitals:   09/16/18 1135  BP: 132/71  Pulse: 84  Resp: 16  Temp: 98 F (36.7 C)  TempSrc: Oral  SpO2: 98%  Weight: 255 lb (115.7 kg)     Physical Exam Constitutional:      Appearance: Normal appearance. He is obese.  Cardiovascular:     Rate and Rhythm: Normal rate.  Pulmonary:     Effort: Pulmonary effort is normal.  Abdominal:     General: Abdomen is protuberant.     Palpations: Abdomen is soft.     Tenderness: There is abdominal tenderness.    Skin:    General: Skin is warm and dry.  Neurological:     Mental Status: He is alert and oriented to person, place, and time. Mental status is at baseline.  Psychiatric:        Mood and Affect: Mood normal.        Behavior: Behavior normal.         Assessment & Plan    1.  Generalized abdominal pain  Abdominal pain in umbilical area but without grossly visible or palpable mass. I have reviewed CT abdomen pelvis from 2014 and agree with findings. Patient reports vomiting and increased pain but also reports ability to still have bowel movements and passing gas. He is afebrile and nontoxic appearing in the clinic. I have called to general surgery and they are rotating providers in clinic due to coronavirus so nobody is in clinic today. Spoke with Dr. Aleen Campi about this patient who agrees he can be seen in clinic Monday. Have advised patient to schedule appointment on Monday and advised on return precautions. Advised to start metamucil or miralax. Counseled that he is not likely to see his hernia in his chest wall.   The entirety of the information documented in the History of Present Illness, Review of Systems and Physical Exam were personally obtained by me. Portions of this information were initially documented by Hetty Ely, CMA and reviewed by  me for thoroughness and accuracy.    I,July Linam M Corneshia Hines,acting as a Neurosurgeon for Trey Sailors, PA-C.,have documented all relevant documentation on the behalf of Trey Sailors, PA-C,as directed by  Trey Sailors, PA-C while in the presence of Trey Sailors, PA-C.    Trey Sailors, PA-C  [facility]

## 2018-09-20 ENCOUNTER — Encounter: Payer: Self-pay | Admitting: General Surgery

## 2018-09-20 ENCOUNTER — Ambulatory Visit (INDEPENDENT_AMBULATORY_CARE_PROVIDER_SITE_OTHER): Payer: 59 | Admitting: General Surgery

## 2018-09-20 ENCOUNTER — Other Ambulatory Visit: Payer: Self-pay

## 2018-09-20 VITALS — BP 128/74 | HR 76 | Temp 97.5°F | Ht 70.0 in | Wt 254.0 lb

## 2018-09-20 DIAGNOSIS — T148XXA Other injury of unspecified body region, initial encounter: Secondary | ICD-10-CM | POA: Diagnosis not present

## 2018-09-20 NOTE — Patient Instructions (Signed)
  Patient to return to work and follow up as needed. The patient is aware to call back for any questions or concerns.

## 2018-09-20 NOTE — Progress Notes (Signed)
Patient ID: Edward Munoz, male   DOB: August 08, 1990, 28 y.o.   MRN: [id number]  Chief Complaint  Patient presents with  . Other    HPI Edward Munoz is a 28 y.o. male here today for a evaluation of a umbilical hernia. He noticed this area about six years. He states last week the area started hurting. So nausea last week. He states in the last year the area has became bigger.  HPI  Past Medical History:  Diagnosis Date  . Hernia     Past Surgical History:  Procedure Laterality Date  . HERNIA REPAIR     4th grade inguinal    Family History  Problem Relation Age of Onset  . Cancer Mother        breast  diagnosed in 27  . Cancer Maternal Aunt        colon    Social History Social History   Tobacco Use  . Smoking status: Current Every Day Smoker    Packs/day: 0.25    Years: 1.00    Pack years: 0.25    Types: Cigarettes  . Smokeless tobacco: Never Used  . Tobacco comment: has not smoked in the past 3 months  Substance Use Topics  . Alcohol use: Yes  . Drug use: No    Allergies  Allergen Reactions  . Codeine Hives  . Penicillins Hives    No current outpatient medications on file.   No current facility-administered medications for this visit.     Review of Systems Review of Systems  Constitutional: Negative.   Respiratory: Negative.   Cardiovascular: Negative.     Blood pressure 128/74, pulse 76, temperature (!) 97.5 F (36.4 C), temperature source Skin, height 5\' 10"  (1.778 m), weight 254 lb (115.2 kg), SpO2 98 %.  Physical Exam Physical Exam Constitutional:      Appearance: Normal appearance. He is well-developed.  Eyes:     General: No scleral icterus.    Conjunctiva/sclera: Conjunctivae normal.  Cardiovascular:     Rate and Rhythm: Normal rate and regular rhythm.     Heart sounds: Normal heart sounds.  Pulmonary:     Effort: Pulmonary effort is normal.     Breath sounds: Normal breath sounds.  Abdominal:     General: Bowel sounds are normal.      Palpations: Abdomen is soft. There is no hepatomegaly.     Tenderness: There is abdominal tenderness in the periumbilical area.    Lymphadenopathy:     Cervical: No cervical adenopathy.  Skin:    General: Skin is warm and dry.  Neurological:     Mental Status: He is alert and oriented to person, place, and time.     Data Reviewed March 29, 2013 imaging review noted at the time of his evaluation for similar discomfort: CT of the abdomen without contrast dated 03/25/2013 was reviewed. Mild thickening of the skin to the left of the umbilicus was thought to possibly represent inflammation. Fingerlike projection of peritoneal fat at the umbilical area. No other abnormalities of the subcutaneous fat. No evidence of fascial abnormality except the small defect related to the umbilicus.  Assessment No evidence of abdominal wall/umbilical hernia. Clear cardiopulmonary exam without evidence of infection.  Plan I do not see any evidence of a anterior abdominal wall hernia.  Both at the time of the 2014 examined today where he is most tender is in the mid body of the left rectus muscle, highly unlikely for a defect in a  healthy male.  There is tenderness with firm palpation at the umbilicus but perhaps a 2 mm fascial defect that does not correspond to the area of his clinical concern.  At this time I do not think there is any indication for repeat imaging.  He was given a note to be able to return to work.  Local heat for symptomatic comfort encouraged.  Patient to return to work and follow up as needed. The patient is aware to call back for any questions or concerns.   HPI, Physical Exam, Assessment and Plan have been scribed under the direction and in the presence of Donnalee Curry, MD.  Ples Specter, CMA  I have completed the exam and reviewed the above documentation for accuracy and completeness.  I agree with the above.  Museum/gallery conservator has been used and any errors in dictation  or transcription are unintentional.  Donnalee Curry, M.D., F.A.C.S.  Merrily Pew Brittne Kawasaki 09/20/2018, 3:39 PM

## 2018-11-04 ENCOUNTER — Ambulatory Visit (INDEPENDENT_AMBULATORY_CARE_PROVIDER_SITE_OTHER): Payer: 59 | Admitting: Physician Assistant

## 2018-11-04 DIAGNOSIS — A084 Viral intestinal infection, unspecified: Secondary | ICD-10-CM | POA: Diagnosis not present

## 2018-11-04 NOTE — Progress Notes (Signed)
       Patient: Edward Munoz Male    DOB: 1990-07-08   28 y.o.   MRN: [id number] Visit Date: 11/04/2018  Today's Provider: Trey Sailors, PA-C   Chief Complaint  Patient presents with  . Medical Clearance    Patient presents to office for clearance to return back to normal work duties.    Subjective:    Virtual Visit via Video Note  I connected with Edward Munoz on 11/04/18 at  1:40 PM EDT by a video enabled telemedicine application and verified that I am speaking with the correct person using two identifiers.   I discussed the limitations of evaluation and management by telemedicine and the availability of in person appointments. The patient expressed understanding and agreed to proceed.   Patient location: home Provider location: [facility]/home office  Persons involved in the visit: patient, provider   Interactive audio and video communications were attempted, although failed due to patient's inability to connect to video. Continued visit with audio only interaction with patient agreement.  HPI  Patient reports he is presenting today for clearance note to return to work. He reports experiencing watery diarrhea x several days. He reports vomiting twice. He used some zofran and immodium with good relief. Reports diarrhea is improved and vomiting has resolved. He denies nausea. Denies fever. Reports symptoms started Wednesday night, symptoms ended yesterday. Reports girlfriend had the same issues. No antibiotics in the past three months, no hospitalization. Works at the [employer] at [employer], [place].   Allergies  Allergen Reactions  . Codeine Hives  . Penicillins Hives    No current outpatient medications on file.  Review of Systems  Social History   Tobacco Use  . Smoking status: Current Every Day Smoker    Packs/day: 0.25    Years: 1.00    Pack years: 0.25    Types: Cigarettes  . Smokeless tobacco: Never Used  . Tobacco comment: has not  smoked in the past 3 months  Substance Use Topics  . Alcohol use: Yes      Objective:   There were no vitals taken for this visit. There were no vitals filed for this visit.   Physical Exam      Assessment & Plan    1. 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 I think he should be at least 48 hours out from his symptoms, which he is not quite yet. 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 Will write for him to return Tuesday. He has some old zofran which he is using with relief and also imodium - he may continue these. Counseled on pushing fluids and avoiding dairy.  The entirety of the information documented in the History of Present Illness, Review of Systems and Physical Exam were personally obtained by Munoz. Portions of this information were initially documented by Sheliah Hatch, CMA and reviewed by Munoz for thoroughness and accuracy.   F/u PRN.     Trey Sailors, PA-C  [facility]

## 2018-11-04 NOTE — Patient Instructions (Signed)
Viral Gastroenteritis, Adult    Viral gastroenteritis is also known as the stomach flu. This condition is caused by certain germs (viruses). These germs can be passed from person to person very easily (are very contagious). This condition can cause sudden watery poop (diarrhea), fever, and throwing up (vomiting).  Having watery poop and throwing up can make you feel weak and cause you to get dehydrated. Dehydration can make you tired and thirsty, make you have a dry mouth, and make it so you pee (urinate) less often. Older adults and people with other diseases or a weak defense system (immune system) are at higher risk for dehydration. It is important to replace the fluids that you lose from having watery poop and throwing up.  Follow these instructions at home:  Follow instructions from your doctor about how to care for yourself at home.  Eating and drinking  Follow these instructions as told by your doctor:   Take an oral rehydration solution (ORS). This is a drink that is sold at pharmacies and stores.   Drink clear fluids in small amounts as you are able, such as:  ? Water.  ? Ice chips.  ? Diluted fruit juice.  ? Low-calorie sports drinks.   Eat bland, easy-to-digest foods in small amounts as you are able, such as:  ? Bananas.  ? Applesauce.  ? Rice.  ? Low-fat (lean) meats.  ? Toast.  ? Crackers.   Avoid fluids that have a lot of sugar or caffeine in them.   Avoid alcohol.   Avoid spicy or fatty foods.  General instructions     Drink enough fluid to keep your pee (urine) clear or pale yellow.   Wash your hands often. If you cannot use soap and water, use hand sanitizer.   Make sure that all people in your home wash their hands well and often.   Rest at home while you get better.   Take over-the-counter and prescription medicines only as told by your doctor.   Watch your condition for any changes.   Take a warm bath to help with any burning or pain from having watery poop.   Keep all follow-up  visits as told by your doctor. This is important.  Contact a doctor if:   You cannot keep fluids down.   Your symptoms get worse.   You have new symptoms.   You feel light-headed or dizzy.   You have muscle cramps.  Get help right away if:   You have chest pain.   You feel very weak or you pass out (faint).   You see blood in your throw-up.   Your throw-up looks like coffee grounds.   You have bloody or black poop (stools) or poop that look like tar.   You have a very bad headache, a stiff neck, or both.   You have a rash.   You have very bad pain, cramping, or bloating in your belly (abdomen).   You have trouble breathing.   You are breathing very quickly.   Your heart is beating very quickly.   Your skin feels cold and clammy.   You feel confused.   You have pain when you pee.   You have signs of dehydration, such as:  ? Dark pee, hardly any pee, or no pee.  ? Cracked lips.  ? Dry mouth.  ? Sunken eyes.  ? Sleepiness.  ? Weakness.  This information is not intended to replace advice given to you by your   health care provider. Make sure you discuss any questions you have with your health care provider.  Document Released: 11/25/2007 Document Revised: 03/02/2018 Document Reviewed: 02/12/2015  Elsevier Interactive Patient Education  2019 Elsevier Inc.

## 2019-01-05 ENCOUNTER — Telehealth: Payer: Self-pay | Admitting: Physician Assistant

## 2019-01-05 NOTE — Telephone Encounter (Signed)
Yes, can schedule phone visit.

## 2019-01-05 NOTE — Telephone Encounter (Signed)
Pt called saying he was exposed last Friday to someone who tested positive this morning.  He wants to know if he should be tested   CB#  [phone]  [place]

## 2019-01-05 NOTE — Telephone Encounter (Signed)
Patient advised. Phone visit scheduled tomorrow at 8am.

## 2019-01-06 ENCOUNTER — Ambulatory Visit (INDEPENDENT_AMBULATORY_CARE_PROVIDER_SITE_OTHER): Payer: 59 | Admitting: Physician Assistant

## 2019-01-06 ENCOUNTER — Encounter: Payer: Self-pay | Admitting: Physician Assistant

## 2019-01-06 VITALS — Temp 98.5°F

## 2019-01-06 DIAGNOSIS — Z20828 Contact with and (suspected) exposure to other viral communicable diseases: Secondary | ICD-10-CM

## 2019-01-06 DIAGNOSIS — Z20822 Contact with and (suspected) exposure to covid-19: Secondary | ICD-10-CM

## 2019-01-06 NOTE — Progress Notes (Signed)
       Patient: Edward Munoz Male    DOB: 1990/12/27   27 y.o.   MRN: [id number] Visit Date: 01/06/2019  Today's Provider: Trinna Post, PA-C   Chief Complaint  Patient presents with  . Cough  . Shortness of Breath   Subjective:    Virtual Visit via Telephone Note  I connected with Edward Munoz on 01/06/19 at  8:00 AM EDT by telephone and verified that I am speaking with the correct person using two identifiers.   I discussed the limitations, risks, security and privacy concerns of performing an evaluation and management service by telephone and the availability of in person appointments. I also discussed with the patient that there may be a patient responsible charge related to this service. The patient expressed understanding and agreed to proceed.  Patient location: home Provider location: [place] office  Persons involved in the visit: patient, provider    HPI  Possible Covid exposure on Friday to confirmed COVID positive from a close contact. Patient states that he coughs due to smoking and it has not changed, some body aches related to arthritis, and diarrhea since last Thursday. Patient states that he has gotten shortness of breath within the last three days. Denies fevers.   Allergies  Allergen Reactions  . Codeine Hives  . Penicillins Hives    No current outpatient medications on file.  Review of Systems  Respiratory: Positive for cough and shortness of breath.   Gastrointestinal: Positive for diarrhea.    Social History   Tobacco Use  . Smoking status: Current Every Day Smoker    Packs/day: 0.25    Years: 1.00    Pack years: 0.25    Types: Cigarettes  . Smokeless tobacco: Never Used  . Tobacco comment: has not smoked in the past 3 months  Substance Use Topics  . Alcohol use: Yes      Objective:   Temp 98.5 F (36.9 C) (Oral)  Vitals:   01/06/19 0817  Temp: 98.5 F (36.9 C)  TempSrc: Oral     Physical Exam  Pulmonary:     Comments: Talking in complete sentences.     No results found for any visits on 01/06/19.     Assessment & Plan    1. Close Exposure to Covid-19 Virus  Explained symptomatic treatment with tylenol and cough suppressant. Directed him to drive up testing center at [facility] clinic. Explained quarantine. Work note provided.   - Novel Coronavirus, [facility] ([facility]) - MyChart COVID-19 home monitoring program; Future - Temperature monitoring; Future  The entirety of the information documented in the History of Present Illness, Review of Systems and Physical Exam were personally obtained by me. Portions of this information were initially documented by Doran Clay, LPN and reviewed by me for thoroughness and accuracy.   F/u PRN     Trinna Post, PA-C  [facility]

## 2019-01-06 NOTE — Patient Instructions (Signed)
This information is directly available on the CDC website: https://www.cdc.gov/coronavirus/2019-ncov/if-you-are-sick/steps-when-sick.html    Source:CDC Reference to specific commercial products, manufacturers, companies, or trademarks does not constitute its endorsement or recommendation by the U.S. Government, Department of Health and Human Services, or Centers for Disease Control and Prevention.  

## 2019-01-07 ENCOUNTER — Encounter (INDEPENDENT_AMBULATORY_CARE_PROVIDER_SITE_OTHER): Payer: Self-pay

## 2019-01-08 ENCOUNTER — Encounter (INDEPENDENT_AMBULATORY_CARE_PROVIDER_SITE_OTHER): Payer: Self-pay

## 2019-01-09 ENCOUNTER — Encounter (INDEPENDENT_AMBULATORY_CARE_PROVIDER_SITE_OTHER): Payer: Self-pay

## 2019-01-10 ENCOUNTER — Encounter: Payer: Self-pay | Admitting: Physician Assistant

## 2019-01-10 ENCOUNTER — Encounter (INDEPENDENT_AMBULATORY_CARE_PROVIDER_SITE_OTHER): Payer: Self-pay

## 2019-01-11 ENCOUNTER — Telehealth: Payer: Self-pay

## 2019-01-11 ENCOUNTER — Encounter (INDEPENDENT_AMBULATORY_CARE_PROVIDER_SITE_OTHER): Payer: Self-pay

## 2019-01-11 LAB — NOVEL CORONAVIRUS, NAA: SARS-CoV-2, NAA: DETECTED — AB

## 2019-01-11 NOTE — Telephone Encounter (Signed)
[facility] Courtesy call - COVID19 Questionnaire, unable to leave message, per recording, th voicemail is full and can not accept anymore messages. Message sent through my chart.

## 2019-01-13 ENCOUNTER — Encounter (INDEPENDENT_AMBULATORY_CARE_PROVIDER_SITE_OTHER): Payer: Self-pay

## 2019-01-14 ENCOUNTER — Encounter (INDEPENDENT_AMBULATORY_CARE_PROVIDER_SITE_OTHER): Payer: Self-pay

## 2019-01-15 ENCOUNTER — Encounter (INDEPENDENT_AMBULATORY_CARE_PROVIDER_SITE_OTHER): Payer: Self-pay

## 2019-01-16 ENCOUNTER — Encounter: Payer: Self-pay | Admitting: Physician Assistant

## 2019-01-16 ENCOUNTER — Encounter (INDEPENDENT_AMBULATORY_CARE_PROVIDER_SITE_OTHER): Payer: Self-pay

## 2019-01-17 ENCOUNTER — Encounter: Payer: Self-pay | Admitting: Physician Assistant

## 2019-01-17 ENCOUNTER — Encounter (INDEPENDENT_AMBULATORY_CARE_PROVIDER_SITE_OTHER): Payer: Self-pay

## 2019-06-21 ENCOUNTER — Ambulatory Visit: Payer: 59 | Attending: Internal Medicine

## 2019-06-21 DIAGNOSIS — Z20822 Contact with and (suspected) exposure to covid-19: Secondary | ICD-10-CM

## 2019-06-22 LAB — NOVEL CORONAVIRUS, NAA: SARS-CoV-2, NAA: NOT DETECTED

## 2019-12-30 ENCOUNTER — Emergency Department
Admission: EM | Admit: 2019-12-30 | Discharge: 2019-12-30 | Disposition: A | Discharge status: Home / Self Care | Payer: 59 | Attending: Emergency Medicine | Admitting: Emergency Medicine

## 2019-12-30 ENCOUNTER — Encounter: Payer: Self-pay | Admitting: Intensive Care

## 2019-12-30 ENCOUNTER — Other Ambulatory Visit: Payer: Self-pay

## 2019-12-30 DIAGNOSIS — M545 Low back pain: Secondary | ICD-10-CM | POA: Diagnosis present

## 2019-12-30 DIAGNOSIS — M7918 Myalgia, other site: Secondary | ICD-10-CM | POA: Diagnosis not present

## 2019-12-30 DIAGNOSIS — F17210 Nicotine dependence, cigarettes, uncomplicated: Secondary | ICD-10-CM | POA: Insufficient documentation

## 2019-12-30 MED ORDER — IBUPROFEN 800 MG PO TABS: 800.0000 mg | ORAL_TABLET | Freq: Three times a day (TID) | ORAL | 0 refills | Status: DC | PRN

## 2019-12-30 MED ORDER — CYCLOBENZAPRINE HCL 10 MG PO TABS: 10.0000 mg | ORAL_TABLET | Freq: Three times a day (TID) | ORAL | 0 refills | Status: DC | PRN

## 2019-12-30 MED ORDER — TRAMADOL HCL 50 MG PO TABS
50.0000 mg | ORAL_TABLET | Freq: Four times a day (QID) | ORAL | 0 refills | Status: AC | PRN
Start: 2019-12-30 — End: 2020-12-29
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
## 2019-12-30 NOTE — Discharge Instructions (Addendum)
Follow discharge care instruction take medication as directed.  Be advised the pain medication muscle relaxer may cause drowsiness.
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
## 2020-09-26 IMAGING — US US EXTREM UP *R* LTD
1 series · 14 of 18 positions shown · non-contrast
Comparison: None

CLINICAL DATA: Evaluate for foreign body in right index finger.

EXAM:
ULTRASOUND RIGHT UPPER EXTREMITY LIMITED
TECHNIQUE: Ultrasound examination of the upper extremity soft tissues was
performed in the area of clinical concern.

[Series 1: us extrem up *right* ltd · 0.06mm/px · 18 acquisitions, 14 frames shown]
[im 1/18]
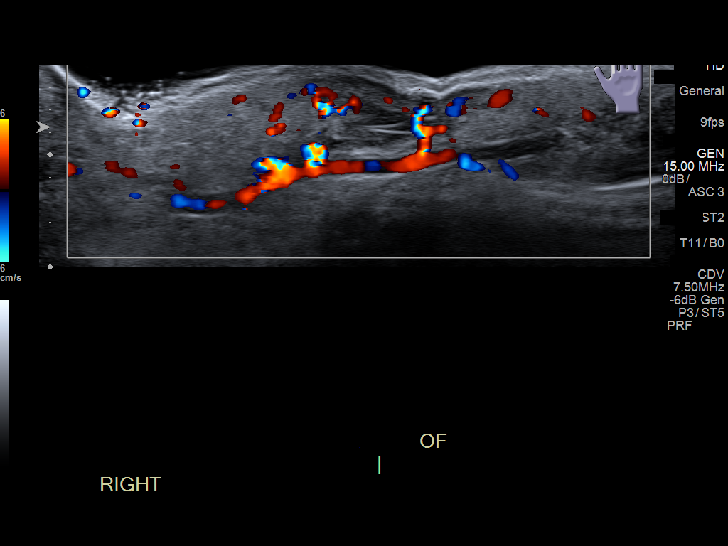
[im 2/18]
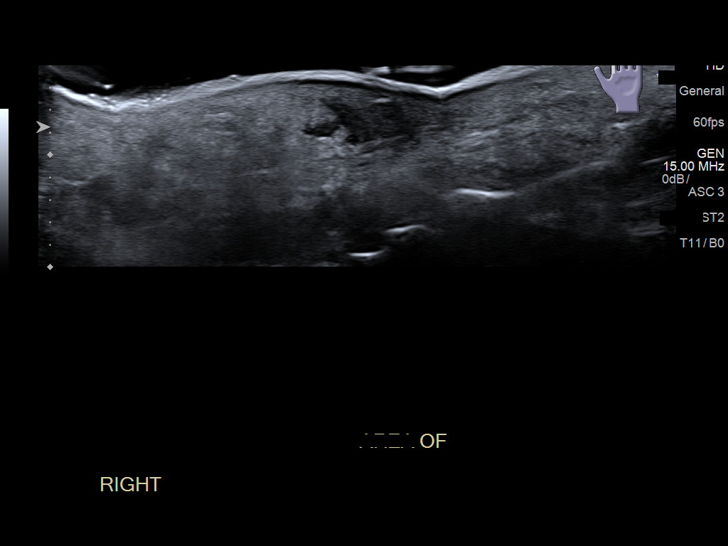
[im 4/18]
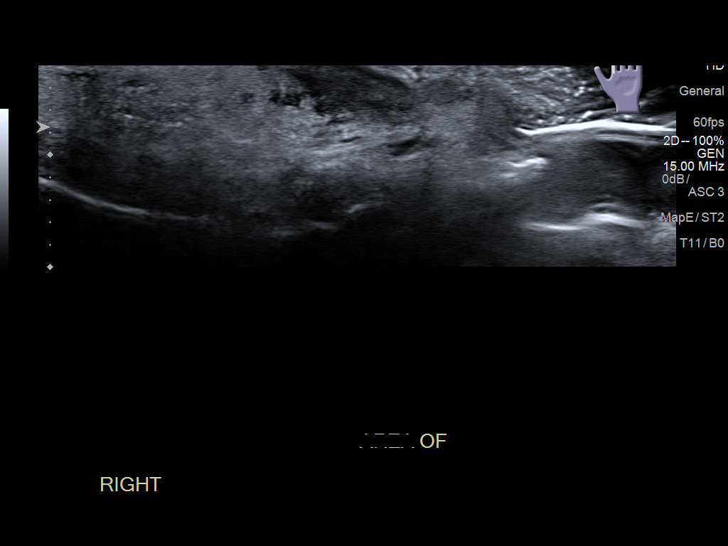
[im 5/18]
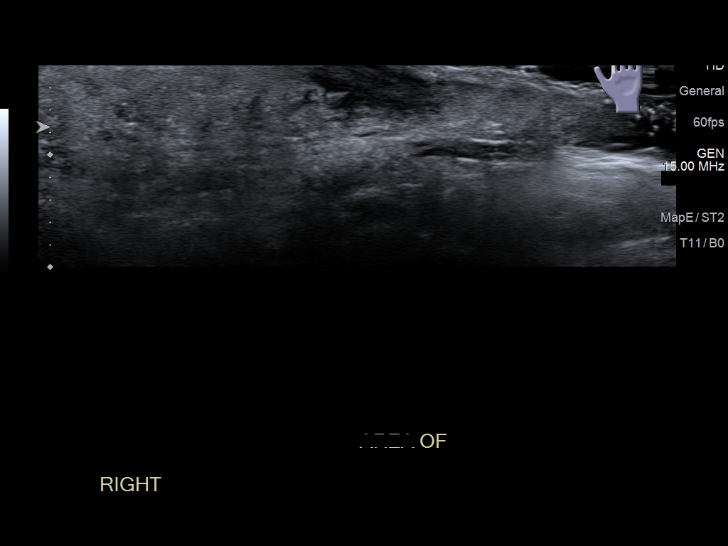
[im 6/18]
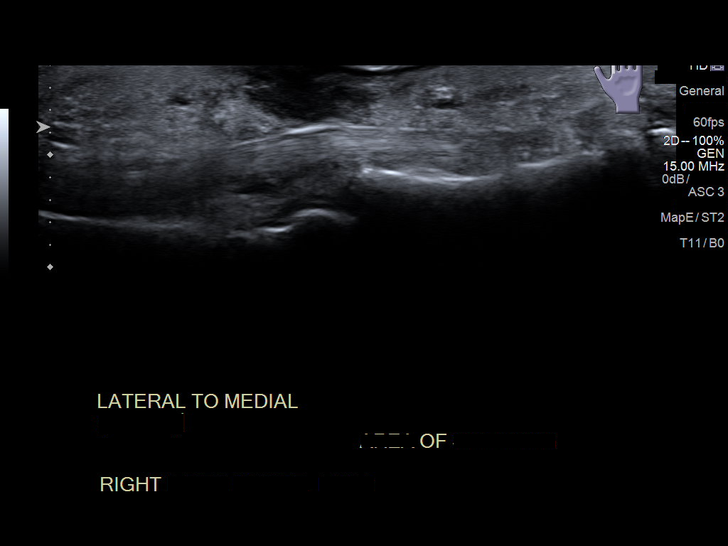
[im 8/18]
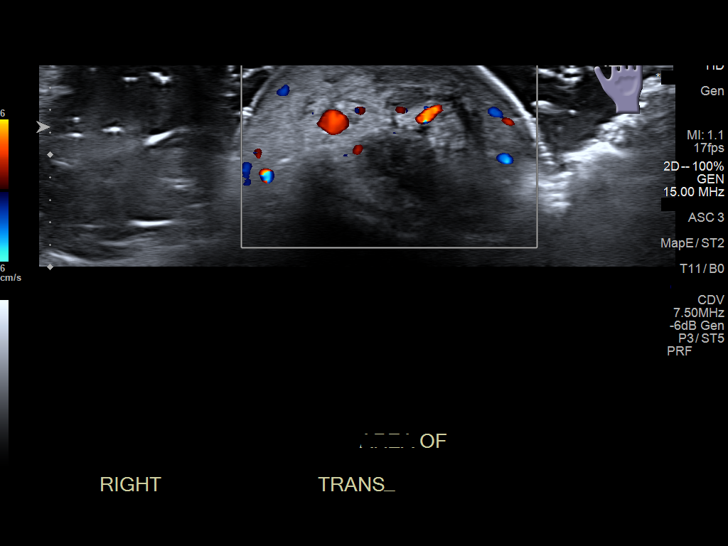
[im 9/18]
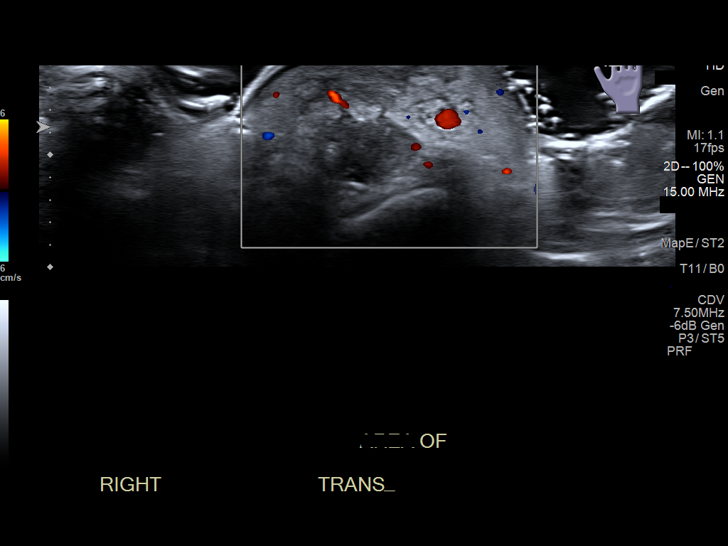
[im 10/18]
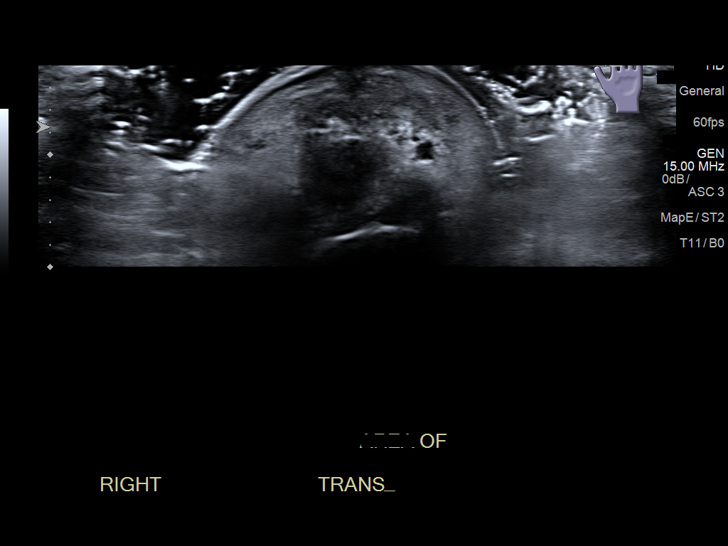
[im 11/18]
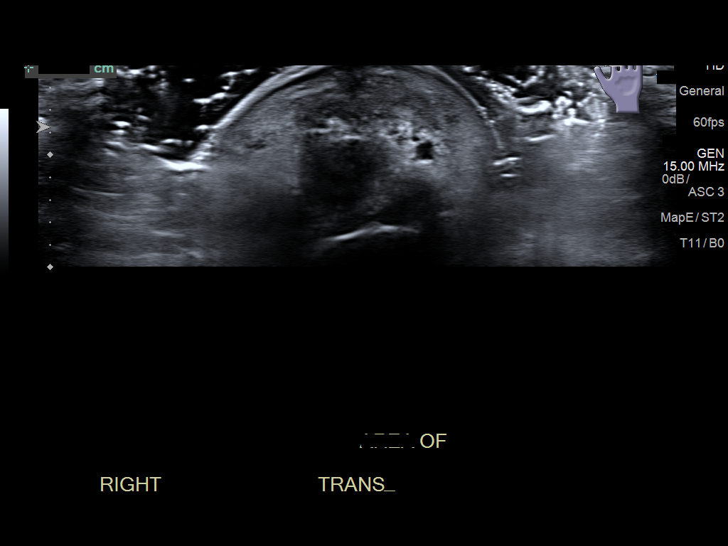
[im 13/18]
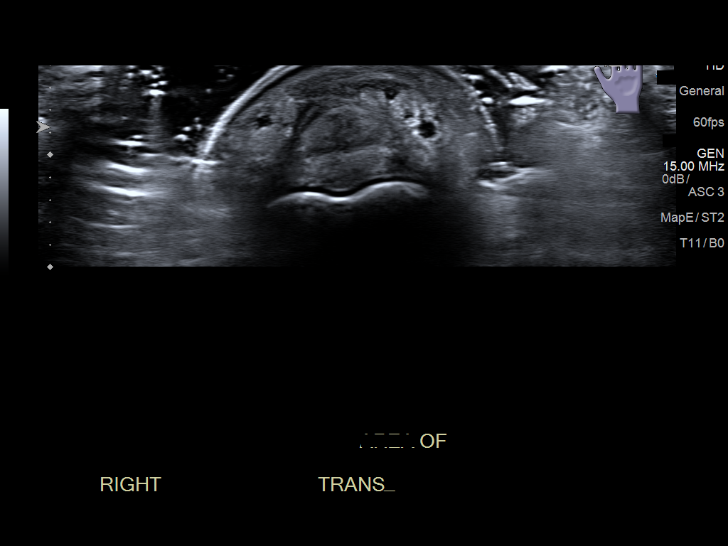
[im 14/18]
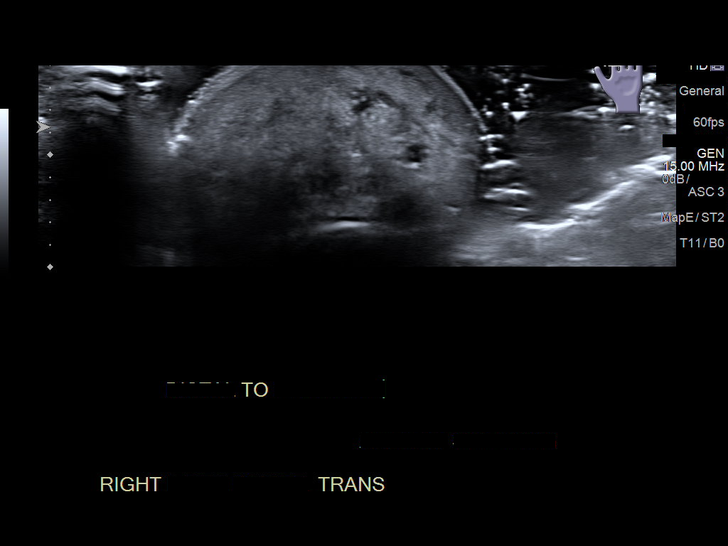
[im 15/18]
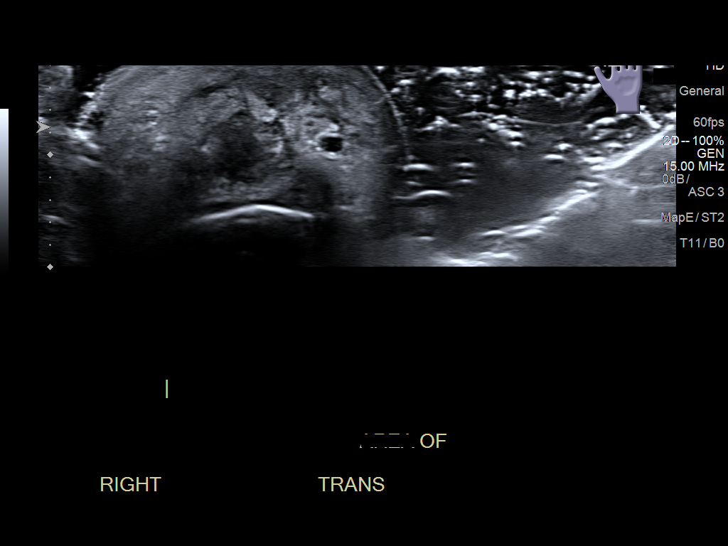
[im 17/18]
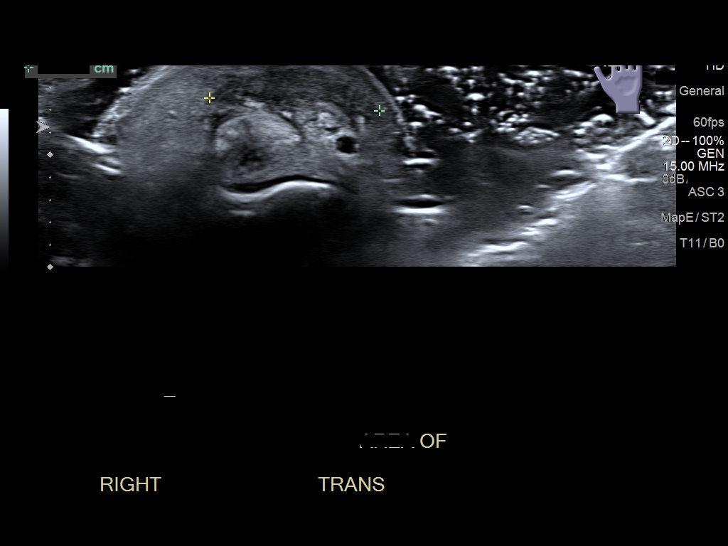
[im 18/18]
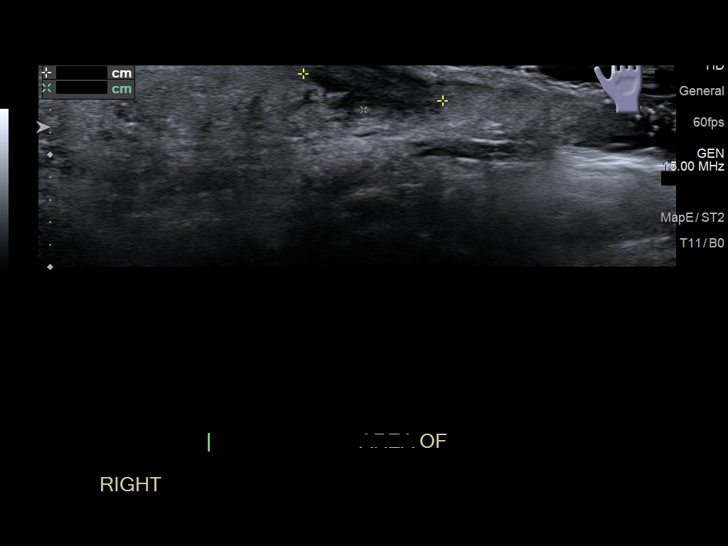

[14 of 18 positions shown; findings below may reference images not displayed]

FINDINGS: Real-time sonography of the right second digit was performed with a
high-frequency linear transducer. 1.3 x 0.5 x 1.5 cm hypoechoic area
at the site of puncture injury without internal Doppler flow. No
echogenic focus is suggest a foreign body.
IMPRESSION: No sonographic abnormality to suggest a foreign body in the right
second digit. 1.3 x 0.5 x 1.5 cm hypoechoic area at the site of
puncture wound which may reflect small area of hematoma or
developing abscess.

## 2021-03-18 DIAGNOSIS — F17200 Nicotine dependence, unspecified, uncomplicated: Secondary | ICD-10-CM | POA: Insufficient documentation

## 2021-10-23 DIAGNOSIS — T2024XA Burn of second degree of nose (septum), initial encounter: Secondary | ICD-10-CM | POA: Insufficient documentation

## 2021-10-23 DIAGNOSIS — T23261A Burn of second degree of back of right hand, initial encounter: Secondary | ICD-10-CM | POA: Insufficient documentation

## 2021-11-13 ENCOUNTER — Encounter: Payer: Self-pay | Admitting: Family Medicine

## 2021-11-13 ENCOUNTER — Ambulatory Visit (INDEPENDENT_AMBULATORY_CARE_PROVIDER_SITE_OTHER): Payer: Managed Care, Other (non HMO) | Admitting: Family Medicine

## 2021-11-13 VITALS — BP 131/90 | HR 99 | Temp 97.8°F | Resp 16 | Ht 70.0 in | Wt 282.9 lb

## 2021-11-13 DIAGNOSIS — K2100 Gastro-esophageal reflux disease with esophagitis, without bleeding: Secondary | ICD-10-CM

## 2021-11-13 DIAGNOSIS — K30 Functional dyspepsia: Secondary | ICD-10-CM | POA: Insufficient documentation

## 2021-11-13 MED ORDER — OMEPRAZOLE 20 MG PO CPDR: 20.0000 mg | DELAYED_RELEASE_CAPSULE | Freq: Two times a day (BID) | ORAL | 1 refills | Status: DC

## 2021-11-13 MED ORDER — SUCRALFATE 1 G PO TABS: 1.0000 g | ORAL_TABLET | Freq: Three times a day (TID) | ORAL | 1 refills | Status: DC

## 2021-11-13 NOTE — Assessment & Plan Note (Signed)
Chronic, recent exacerbation Was out of town due to death of grand mother and ate/drank too much Has previously been on Prilosec currently off Continues to have large amounts of belching encourage BID use for 1 month; refill provided for additional month if needed Will refer to GI if symptoms continue

## 2021-11-13 NOTE — Progress Notes (Signed)
Established patient visit   Patient: Edward Munoz   DOB: [birth year]   31 y.o. Male  MRN: [id number] Visit Date: 11/13/2021  Today's healthcare provider: Gwyneth Sprout, FNP  Patient presents for new patient visit to establish care.  Introduced to Designer, jewellery role and practice setting.  All questions answered.  Discussed provider/patient relationship and expectations.   I,Tiffany J Bragg,acting as a scribe for Gwyneth Sprout, FNP.,have documented all relevant documentation on the behalf of Gwyneth Sprout, FNP,as directed by  Gwyneth Sprout, FNP while in the presence of Gwyneth Sprout, FNP.   Chief Complaint  Patient presents with   Diarrhea    Patient complains of diarrhea, nausea, and stomach cramps starting yesterday morning.    Subjective    HPI HPI     Diarrhea    Additional comments: Patient complains of diarrhea, nausea, and stomach cramps starting yesterday morning.       Last edited by Smitty Knudsen, CMA on 11/13/2021  2:56 PM.       Medications: Outpatient Medications Prior to Visit  Medication Sig   [DISCONTINUED] cyclobenzaprine (FLEXERIL) 10 MG tablet Take 1 tablet (10 mg total) by mouth 3 (three) times daily as needed. (Patient not taking: Reported on 11/13/2021)   [DISCONTINUED] ibuprofen (ADVIL) 800 MG tablet Take 1 tablet (800 mg total) by mouth every 8 (eight) hours as needed for moderate pain. (Patient not taking: Reported on 11/13/2021)   No facility-administered medications prior to visit.    Review of Systems     Objective    BP 131/90 (BP Location: Left Arm, Patient Position: Sitting, Cuff Size: Large)   Pulse 99   Temp 97.8 F (36.6 C) (Oral)   Resp 16   Ht 5\' 10"  (1.778 m)   Wt 282 lb 14.4 oz (128.3 kg)   SpO2 98%   BMI 40.59 kg/m    Physical Exam Vitals and nursing note reviewed.  Constitutional:      General: He is not in acute distress.    Appearance: Normal appearance. He is obese. He is not ill-appearing,  toxic-appearing or diaphoretic.  HENT:     Head: Normocephalic and atraumatic.  Eyes:     Pupils: Pupils are equal, round, and reactive to light.  Cardiovascular:     Rate and Rhythm: Normal rate and regular rhythm.     Pulses: Normal pulses.     Heart sounds: Normal heart sounds.  Pulmonary:     Effort: Pulmonary effort is normal.     Breath sounds: Normal breath sounds.  Abdominal:     Tenderness: There is abdominal tenderness.     Comments: Rounded, obese abdomen   Musculoskeletal:        General: Normal range of motion.     Cervical back: Normal range of motion.  Skin:    General: Skin is warm and dry.     Capillary Refill: Capillary refill takes less than 2 seconds.     Findings: Erythema present.     Comments: Healing burn/scar on R hand and nose   Neurological:     General: No focal deficit present.     Mental Status: He is alert and oriented to person, place, and time. Mental status is at baseline.  Psychiatric:        Mood and Affect: Mood normal.        Behavior: Behavior normal.        Thought Content: Thought content normal.  Judgment: Judgment normal.      No results found for any visits on 11/13/21.  Assessment & Plan     Problem List Items Addressed This Visit       Digestive   Gastroesophageal reflux disease with esophagitis without hemorrhage - Primary    Chronic, recent exacerbation Was out of town due to death of grand mother and ate/drank too much Has previously been on Prilosec currently off Continues to have large amounts of belching encourage BID use for 1 month; refill provided for additional month if needed Will refer to GI if symptoms continue       Relevant Medications   omeprazole (PRILOSEC) 20 MG capsule     Other   Indigestion    Acute stomach complaints x 1.5 days; recent in country travel; denies sick contacts Has been able to keep food/drink down since Denies blood BMs Has complaints of watery BMs Hx of previous  gastroenteritis with nausea; no formal diagnosis of chron's dz  Recommend BRAT/bland diet and Carafate QID for 1 month Smoking cessation and routine physical screening care encouraged        Relevant Medications   sucralfate (CARAFATE) 1 g tablet     Return if symptoms worsen or fail to improve, for annual examination- schedule as your calendar allows .      Vonna Kotyk, FNP, have reviewed all documentation for this visit. The documentation on 11/13/21 for the exam, diagnosis, procedures, and orders are all accurate and complete.    Gwyneth Sprout, [place] [phone] (phone) [phone] (fax)  [place]

## 2021-11-13 NOTE — Assessment & Plan Note (Signed)
Acute stomach complaints x 1.5 days; recent in country travel; denies sick contacts Has been able to keep food/drink down since Denies blood BMs Has complaints of watery BMs Hx of previous gastroenteritis with nausea; no formal diagnosis of chron's dz  Recommend BRAT/bland diet and Carafate QID for 1 month Smoking cessation and routine physical screening care encouraged

## 2021-11-30 ENCOUNTER — Other Ambulatory Visit: Payer: Self-pay | Admitting: Family Medicine

## 2021-11-30 DIAGNOSIS — K2100 Gastro-esophageal reflux disease with esophagitis, without bleeding: Secondary | ICD-10-CM

## 2021-11-30 DIAGNOSIS — K30 Functional dyspepsia: Secondary | ICD-10-CM

## 2021-12-02 NOTE — Telephone Encounter (Signed)
  Notes to clinic:  Pharmacy requests as follows: Pharmacy comment: REQUEST FOR 90 DAYS PRESCRIPTION.      Requested Prescriptions  Pending Prescriptions Disp Refills   omeprazole (PRILOSEC) 20 MG capsule [Pharmacy Med Name: OMEPRAZOLE DR 20 MG CAPSULE] 180 capsule 1    Sig: Take 1 capsule (20 mg total) by mouth 2 (two) times daily before a meal.     Gastroenterology: Proton Pump Inhibitors Passed - 11/30/2021  2:39 PM      Passed - Valid encounter within last 12 months    Recent Outpatient Visits           2 weeks ago Gastroesophageal reflux disease with esophagitis without hemorrhage   [facility] Gwyneth Sprout, FNP   2 years ago Close Exposure to [place], [place], [place]   3 years ago Viral gastroenteritis   [place], [place], [place]   3 years ago Generalized abdominal pain   [place], [place], [place]   3 years ago Gastroenteritis presumed infectious   [place], [place], [place]               sucralfate (CARAFATE) 1 g tablet [Pharmacy Med Name: SUCRALFATE 1 GM TABLET] 360 tablet 1    Sig: TAKE 1 TABLET (1 G TOTAL) BY MOUTH 4 TIMES A DAY WITH MEALS AND AT BEDTIME     Gastroenterology: Antiacids Passed - 11/30/2021  2:39 PM      Passed - Valid encounter within last 12 months    Recent Outpatient Visits           2 weeks ago Gastroesophageal reflux disease with esophagitis without hemorrhage   [facility] Gwyneth Sprout, FNP   2 years ago Close Exposure to [place], [place], [place]   3 years ago Viral gastroenteritis   [place], [place], [place]   3 years ago Generalized abdominal pain   [place], [place], [place]   3 years ago Gastroenteritis presumed infectious   [place], [place], [place]

## 2022-03-17 ENCOUNTER — Encounter: Payer: Self-pay | Admitting: Family Medicine

## 2022-03-17 ENCOUNTER — Ambulatory Visit (INDEPENDENT_AMBULATORY_CARE_PROVIDER_SITE_OTHER): Payer: Managed Care, Other (non HMO) | Admitting: Family Medicine

## 2022-03-17 VITALS — BP 133/69 | HR 103 | Temp 98.1°F | Resp 16 | Ht 70.0 in | Wt 285.0 lb

## 2022-03-17 DIAGNOSIS — J069 Acute upper respiratory infection, unspecified: Secondary | ICD-10-CM

## 2022-03-17 NOTE — Progress Notes (Unsigned)
     I,Tiffany J Bragg,acting as a scribe for Lelon Huh, MD.,have documented all relevant documentation on the behalf of Lelon Huh, MD,as directed by  Lelon Huh, MD while in the presence of Lelon Huh, MD.   Established patient visit   Patient: Edward Munoz   DOB: Dec 15, 1990   31 y.o. Male  MRN: [id number] Visit Date: 03/17/2022  Today's healthcare provider: Lelon Huh, MD   Chief Complaint  Patient presents with   URI    Patient states he was exposed to RSV, complains of fever, cough, vomiting, starting today.    Subjective    HPI States he had family member exposed to RSV over weekend and since then his twin daughter, step-daughters and wife have all come down with similar respiratory and GI symptoms. He a little short of breath on exertion. Cough is mostly non-productive.   Medications: Outpatient Medications Prior to Visit  Medication Sig   omeprazole (PRILOSEC) 20 MG capsule TAKE 1 CAPSULE (20 MG TOTAL) BY MOUTH 2 (TWO) TIMES DAILY BEFORE A MEAL.   sucralfate (CARAFATE) 1 g tablet TAKE 1 TABLET (1 G TOTAL) BY MOUTH 4 TIMES A DAY WITH MEALS AND AT BEDTIME   No facility-administered medications prior to visit.     {Labs  Heme  Chem  Endocrine  Serology  Results Review (optional):23779}   Objective    BP 133/69 (BP Location: Left Arm, Patient Position: Sitting, Cuff Size: Large)   Pulse (!) 103   Temp 98.1 F (36.7 C) (Oral)   Resp 16   Ht 5\' 10"  (7.425 m)   Wt 285 lb (129.3 kg)   BMI 40.89 kg/m  {Show previous vital signs (optional):23777}  Physical Exam    General: Appearance:    Obese male in no acute distress  Eyes:    PERRL, conjunctiva/corneas clear, EOM's intact       Lungs:     Clear to auscultation bilaterally, respirations unlabored. Coughing occasionally throughout visit.   Heart:    Tachycardic. Normal rhythm. No murmurs, rubs, or gallops.    MS:   All extremities are intact.    Neurologic:   Awake, alert, oriented x 3. No  apparent focal neurological defect.         Assessment & Plan     1. Upper respiratory tract infection, unspecified type Possible RSV exposure. Not in any distress. His kids are going to pediatrician tomorrow for similar sx. Recommend he take Covid test. Counseled he would need to be afebrile off of anti-pyretics for at least 24 hours before returning to work. Work excuse given from 9-26, 27, and 28 and to let me know if we need if he is unable to work afterwords so we can extend work excuse.       {provider attestation***:1}   Lelon Huh, MD  [facility] [phone] (phone) [phone] (fax)  [place]

## 2022-08-03 ENCOUNTER — Encounter: Payer: Self-pay | Admitting: Family Medicine

## 2022-08-03 ENCOUNTER — Ambulatory Visit (INDEPENDENT_AMBULATORY_CARE_PROVIDER_SITE_OTHER): Payer: Managed Care, Other (non HMO) | Admitting: Family Medicine

## 2022-08-03 VITALS — BP 138/86 | HR 95 | Temp 98.6°F | Ht 70.0 in | Wt 275.0 lb

## 2022-08-03 DIAGNOSIS — Z6839 Body mass index (BMI) 39.0-39.9, adult: Secondary | ICD-10-CM

## 2022-08-03 DIAGNOSIS — K2100 Gastro-esophageal reflux disease with esophagitis, without bleeding: Secondary | ICD-10-CM | POA: Diagnosis not present

## 2022-08-03 DIAGNOSIS — Z720 Tobacco use: Secondary | ICD-10-CM

## 2022-08-03 DIAGNOSIS — Z136 Encounter for screening for cardiovascular disorders: Secondary | ICD-10-CM

## 2022-08-03 DIAGNOSIS — Z13220 Encounter for screening for lipoid disorders: Secondary | ICD-10-CM | POA: Insufficient documentation

## 2022-08-03 DIAGNOSIS — Z1329 Encounter for screening for other suspected endocrine disorder: Secondary | ICD-10-CM | POA: Diagnosis not present

## 2022-08-03 DIAGNOSIS — Z114 Encounter for screening for human immunodeficiency virus [HIV]: Secondary | ICD-10-CM | POA: Insufficient documentation

## 2022-08-03 DIAGNOSIS — R1114 Bilious vomiting: Secondary | ICD-10-CM

## 2022-08-03 DIAGNOSIS — L2081 Atopic neurodermatitis: Secondary | ICD-10-CM | POA: Insufficient documentation

## 2022-08-03 DIAGNOSIS — K529 Noninfective gastroenteritis and colitis, unspecified: Secondary | ICD-10-CM | POA: Diagnosis not present

## 2022-08-03 DIAGNOSIS — Z1159 Encounter for screening for other viral diseases: Secondary | ICD-10-CM

## 2022-08-03 DIAGNOSIS — Z131 Encounter for screening for diabetes mellitus: Secondary | ICD-10-CM | POA: Insufficient documentation

## 2022-08-03 DIAGNOSIS — E6609 Other obesity due to excess calories: Secondary | ICD-10-CM

## 2022-08-03 MED ORDER — TRIAMCINOLONE ACETONIDE 0.5 % EX OINT: 1.0000 | TOPICAL_OINTMENT | Freq: Two times a day (BID) | CUTANEOUS | 0 refills | Status: DC

## 2022-08-03 NOTE — Assessment & Plan Note (Signed)
Acute on chronic, not improved with smaller meals and eating earlier before laying down

## 2022-08-03 NOTE — Assessment & Plan Note (Signed)
5th episode at PCP in 3.5 years; previous differential included GERD, food borne illness, ETOH use/misuse disorder and cannabinoid hyperemesis syndrome Pt reports ongoing s/s not improved with OTC PPI, GasX, Mylanta etc Denies other family members with similar symptoms, children at home without s/s Pt works as a Training and development officer, has for 11 years Reports vomiting of :"sour, rotten" food Has been without ETOH or MJ in 5+ months; congratulated Recommend CBC and CMP as well as screening labs; also recommend H Pylori testing Recommend f/u with GI given possible autoimmune concern given longevity of complaints

## 2022-08-03 NOTE — Progress Notes (Signed)
Established patient visit   Patient: Edward Munoz   DOB: 05/04/1991   32 y.o. Male  MRN: [id number] Visit Date: 08/03/2022  Today's healthcare provider: Gwyneth Sprout, FNP  Introduced to nurse practitioner role and practice setting.  All questions answered.  Discussed provider/patient relationship and expectations.   Subjective    ABDOMINAL ISSUES Duration: chronic Nature: sharp, pressure-like, shooting, sore, stabbing, tender, tearing, and throbbing Location: diffuse  Severity: severe  Radiation: no Episode duration: Frequency: intermittent Treatments attempted: antacids, PPI, and laxatives Constipation: no Diarrhea: no Mucous in the stool: no Heartburn: yes Bloating:no Flatulence: no Nausea: no Vomiting: yes Episodes of vomit/day: 1 AM Melena or hematochezia: no Rash: yes; likely unrelated to L wrist  Jaundice: no Fever: no Weight loss: no       Medications: Outpatient Medications Prior to Visit  Medication Sig   simethicone (MYLICON) 0000000 MG chewable tablet Chew 125 mg by mouth every 6 (six) hours as needed for flatulence.   omeprazole (PRILOSEC) 20 MG capsule TAKE 1 CAPSULE (20 MG TOTAL) BY MOUTH 2 (TWO) TIMES DAILY BEFORE A MEAL. (Patient not taking: Reported on 08/03/2022)   sucralfate (CARAFATE) 1 g tablet TAKE 1 TABLET (1 G TOTAL) BY MOUTH 4 TIMES A DAY WITH MEALS AND AT BEDTIME (Patient not taking: Reported on 08/03/2022)   No facility-administered medications prior to visit.    Review of Systems    Objective    BP 138/86   Pulse 95   Temp 98.6 F (37 C)   Ht 5' 10"$  (1.778 m)   Wt 275 lb (124.7 kg)   SpO2 97%   BMI 39.46 kg/m   Physical Exam Vitals and nursing note reviewed.  Constitutional:      Appearance: Normal appearance. He is obese.  HENT:     Head: Normocephalic and atraumatic.  Eyes:     Pupils: Pupils are equal, round, and reactive to light.  Cardiovascular:     Rate and Rhythm: Normal rate and regular rhythm.      Pulses: Normal pulses.     Heart sounds: Normal heart sounds.  Pulmonary:     Effort: Pulmonary effort is normal.     Breath sounds: Normal breath sounds.  Abdominal:     General: Bowel sounds are normal. There is no distension.     Palpations: Abdomen is soft. There is no mass.     Tenderness: There is no abdominal tenderness. There is no guarding.     Hernia: No hernia is present.  Musculoskeletal:        General: Normal range of motion.     Cervical back: Normal range of motion.  Skin:    General: Skin is warm and dry.     Capillary Refill: Capillary refill takes less than 2 seconds.  Neurological:     General: No focal deficit present.     Mental Status: He is alert and oriented to person, place, and time. Mental status is at baseline.  Psychiatric:        Mood and Affect: Mood normal.        Behavior: Behavior normal.        Thought Content: Thought content normal.        Judgment: Judgment normal.     No results found for any visits on 08/03/22.  Assessment & Plan     Problem List Items Addressed This Visit       Digestive   Bilious vomiting without nausea  Acute on chronic, not improved with smaller meals and eating earlier before laying down      Relevant Orders   H. pylori breath test   Gastroenteritis    5th episode at PCP in 3.5 years; previous differential included GERD, food borne illness, ETOH use/misuse disorder and cannabinoid hyperemesis syndrome Pt reports ongoing s/s not improved with OTC PPI, GasX, Mylanta etc Denies other family members with similar symptoms, children at home without s/s Pt works as a Training and development officer, has for 11 years Reports vomiting of :"sour, rotten" food Has been without ETOH or MJ in 5+ months; congratulated Recommend CBC and CMP as well as screening labs; also recommend H Pylori testing Recommend f/u with GI given possible autoimmune concern given longevity of complaints        Relevant Orders   Ambulatory referral to  Gastroenterology   Gastroesophageal reflux disease with esophagitis without hemorrhage - Primary    Chronic, worsening Previously controlled with prilosec 20 mg BID; however, worsening so pt stopped PPI Defer to GI for next steps      Relevant Orders   CBC with Differential/Platelet   Comprehensive Metabolic Panel (CMET)   Hemoglobin A1c   Lipid panel   Magnesium   Ambulatory referral to Gastroenterology     Musculoskeletal and Integument   Atopic neurodermatitis    Acute, L wrist Likely chronic water exposure/watch strap irritation Trial of low dose steroid to assist       Relevant Medications   triamcinolone ointment (KENALOG) 0.5 %     Other   Class 2 obesity due to excess calories without serious comorbidity with body mass index (BMI) of 39.0 to 39.9 in adult    Chronic, stable Body mass index is 39.46 kg/m. Continue to recommend balanced, lower carb meals. Smaller meal size, adding snacks. Choosing water as drink of choice and increasing purposeful exercise.       Encounter for hepatitis C screening test for low risk patient   Relevant Orders   Hepatitis C Antibody   Encounter for lipid screening for cardiovascular disease   Encounter for screening for HIV   Relevant Orders   HIV antibody (with reflex)   Screening for diabetes mellitus   Relevant Orders   Hemoglobin A1c   Screening for thyroid disorder   Relevant Orders   TSH + free T4   Tobacco use    Chronic, x 11 years 1/4ppd per pt report Counseled on risks Pt defers smoking cessation efforts at this time       Return in about 6 months (around 02/01/2023) for annual examination.     Vonna Kotyk, FNP, have reviewed all documentation for this visit. The documentation on 08/03/22 for the exam, diagnosis, procedures, and orders are all accurate and complete.  Gwyneth Sprout, [place] [phone] (phone) [phone] (fax)  [place]

## 2022-08-03 NOTE — Assessment & Plan Note (Signed)
Acute, L wrist Likely chronic water exposure/watch strap irritation Trial of low dose steroid to assist

## 2022-08-03 NOTE — Assessment & Plan Note (Signed)
Chronic, x 11 years 1/4ppd per pt report Counseled on risks Pt defers smoking cessation efforts at this time

## 2022-08-03 NOTE — Assessment & Plan Note (Signed)
Chronic, worsening Previously controlled with prilosec 20 mg BID; however, worsening so pt stopped PPI Defer to GI for next steps

## 2022-08-03 NOTE — Assessment & Plan Note (Signed)
Chronic, stable Body mass index is 39.46 kg/m. Continue to recommend balanced, lower carb meals. Smaller meal size, adding snacks. Choosing water as drink of choice and increasing purposeful exercise.

## 2022-08-04 LAB — LIPID PANEL
Chol/HDL Ratio: 4.6 ratio (ref 0.0–5.0)
Cholesterol, Total: 144 mg/dL (ref 100–199)
HDL: 31 mg/dL — ABNORMAL LOW (ref >39)
LDL Chol Calc (NIH): 71 mg/dL (ref 0–99)
Triglycerides: 257 mg/dL — ABNORMAL HIGH (ref 0–149)
VLDL Cholesterol Cal: 42 mg/dL — ABNORMAL HIGH (ref 5–40)

## 2022-08-04 LAB — CBC WITH DIFFERENTIAL/PLATELET
Basophils Absolute: 0.1 10*3/uL (ref 0.0–0.2)
Basos: 1 %
EOS (ABSOLUTE): 0.6 10*3/uL — ABNORMAL HIGH (ref 0.0–0.4)
Eos: 6 %
Hematocrit: 49.2 % (ref 37.5–51.0)
Hemoglobin: 16.6 g/dL (ref 13.0–17.7)
Immature Grans (Abs): 0.1 10*3/uL (ref 0.0–0.1)
Immature Granulocytes: 1 %
Lymphocytes Absolute: 3.1 10*3/uL (ref 0.7–3.1)
Lymphs: 28 %
MCH: 30 pg (ref 26.6–33.0)
MCHC: 33.7 g/dL (ref 31.5–35.7)
MCV: 89 fL (ref 79–97)
Monocytes Absolute: 0.9 10*3/uL (ref 0.1–0.9)
Monocytes: 8 %
Neutrophils Absolute: 6.2 10*3/uL (ref 1.4–7.0)
Neutrophils: 56 %
Platelets: 277 10*3/uL (ref 150–450)
RBC: 5.53 x10E6/uL (ref 4.14–5.80)
RDW: 12.8 % (ref 11.6–15.4)
WBC: 11 10*3/uL — ABNORMAL HIGH (ref 3.4–10.8)

## 2022-08-04 LAB — TSH+FREE T4
Free T4: 1.38 ng/dL (ref 0.82–1.77)
TSH: 1.15 u[IU]/mL (ref 0.450–4.500)

## 2022-08-04 LAB — COMPREHENSIVE METABOLIC PANEL
ALT: 35 IU/L (ref 0–44)
AST: 19 IU/L (ref 0–40)
Albumin/Globulin Ratio: 1.8 (ref 1.2–2.2)
Albumin: 4.5 g/dL (ref 4.3–5.2)
Alkaline Phosphatase: 83 IU/L (ref 44–121)
BUN/Creatinine Ratio: 16 (ref 9–20)
BUN: 14 mg/dL (ref 6–20)
Bilirubin Total: 0.3 mg/dL (ref 0.0–1.2)
CO2: 19 mmol/L — ABNORMAL LOW (ref 20–29)
Calcium: 9.7 mg/dL (ref 8.7–10.2)
Chloride: 103 mmol/L (ref 96–106)
Creatinine, Ser: 0.89 mg/dL (ref 0.76–1.27)
Globulin, Total: 2.5 g/dL (ref 1.5–4.5)
Glucose: 80 mg/dL (ref 70–99)
Potassium: 4.2 mmol/L (ref 3.5–5.2)
Sodium: 140 mmol/L (ref 134–144)
Total Protein: 7 g/dL (ref 6.0–8.5)
eGFR: 119 mL/min/{1.73_m2} (ref >59)

## 2022-08-04 LAB — HIV ANTIBODY (ROUTINE TESTING W REFLEX): HIV Screen 4th Generation wRfx: NONREACTIVE

## 2022-08-04 LAB — HEMOGLOBIN A1C
Est. average glucose Bld gHb Est-mCnc: 114 mg/dL
Hgb A1c MFr Bld: 5.6 % (ref 4.8–5.6)

## 2022-08-04 LAB — MAGNESIUM: Magnesium: 2.1 mg/dL (ref 1.6–2.3)

## 2022-08-04 LAB — HEPATITIS C ANTIBODY: Hep C Virus Ab: NONREACTIVE

## 2022-08-04 NOTE — Progress Notes (Signed)
Labs relatively unremarkable; borderline elevation in cell count which could be inflammatory or infectious. Breath test pending. I continue to recommend diet low in saturated fat and regular exercise - 30 min at least 5 times per week for general health and cholesterol management.

## 2022-08-05 LAB — H. PYLORI BREATH TEST: H pylori Breath Test: NEGATIVE

## 2022-08-05 NOTE — Progress Notes (Signed)
Negative for gut bacteria, H Pylori. Continue to recommend f/u with GI.

## 2022-11-30 ENCOUNTER — Ambulatory Visit: Payer: Managed Care, Other (non HMO) | Admitting: Gastroenterology

## 2022-11-30 ENCOUNTER — Encounter: Payer: Self-pay | Admitting: Gastroenterology

## 2022-11-30 NOTE — Progress Notes (Deleted)
    Wyline Mood MD, MRCP(U.K) [address]  [address]  [place], [place] [address]  Main: [phone]  Fax: [phone]   Gastroenterology Consultation  Referring Provider:     Jacky Kindle, FNP Primary Care Physician:  Jacky Kindle, FNP Primary Gastroenterologist:  Dr. Wyline Mood  Reason for Consultation:     GERD        HPI:   Edward Munoz is a 32 y.o. y/o male referred for consultation & management  by Jacky Kindle, FNP.        Past Medical History:  Diagnosis Date   Hernia     Past Surgical History:  Procedure Laterality Date   HERNIA REPAIR     4th grade inguinal    Prior to Admission medications   Medication Sig Start Date End Date Taking? Authorizing Provider  omeprazole (PRILOSEC) 20 MG capsule TAKE 1 CAPSULE (20 MG TOTAL) BY MOUTH 2 (TWO) TIMES DAILY BEFORE A MEAL. Patient not taking: Reported on 08/03/2022 12/02/21   Jacky Kindle, FNP  simethicone (MYLICON) 125 MG chewable tablet Chew 125 mg by mouth every 6 (six) hours as needed for flatulence.    [provider]  sucralfate (CARAFATE) 1 g tablet TAKE 1 TABLET (1 G TOTAL) BY MOUTH 4 TIMES A DAY WITH MEALS AND AT BEDTIME Patient not taking: Reported on 08/03/2022 12/02/21   Jacky Kindle, FNP  triamcinolone ointment (KENALOG) 0.5 % Apply 1 Application topically 2 (two) times daily. 08/03/22   Jacky Kindle, FNP    Family History  Problem Relation Age of Onset   Cancer Mother        breast  diagnosed in 1996   Cancer Maternal Aunt        colon     Social History   Tobacco Use   Smoking status: Every Day    Packs/day: .25    Types: Cigarettes    Start date: 06/22/2000   Smokeless tobacco: Never  Vaping Use   Vaping Use: Every day  Substance Use Topics   Alcohol use: Not Currently    Comment: 5 months sober 08/03/22   Drug use: Not Currently    Types: Marijuana    Comment: 5 months "clean" 08/03/22    Allergies as of 11/30/2022 - Review Complete 08/03/2022  Allergen Reaction  Noted   Codeine Hives 03/29/2013   Penicillins Hives 03/29/2013    Review of Systems:    All systems reviewed and negative except where noted in HPI.   Physical Exam:  There were no vitals taken for this visit. No LMP for male patient. Psych:  Alert and cooperative. Normal mood and affect. General:   Alert,  Well-developed, well-nourished, pleasant and cooperative in NAD Head:  Normocephalic and atraumatic. Eyes:  Sclera clear, no icterus.   Conjunctiva pink. Ears:  Normal auditory acuity. Abdomen:  Normal bowel sounds.  No bruits.  Soft, non-tender and non-distended without masses, hepatosplenomegaly or hernias noted.  No guarding or rebound tenderness.    Neurologic:  Alert and oriented x3;  grossly normal neurologically. Psych:  Alert and cooperative. Normal mood and affect.  Imaging Studies: No results found.  Assessment and Plan:   Edward Munoz is a 32 y.o. y/o male has been referred for GERD.   Follow up in ***  Dr Wyline Mood MD,MRCP(U.K)    BP check ***

## 2023-01-29 ENCOUNTER — Encounter: Payer: Managed Care, Other (non HMO) | Admitting: Family Medicine

## 2023-02-02 ENCOUNTER — Ambulatory Visit (INDEPENDENT_AMBULATORY_CARE_PROVIDER_SITE_OTHER): Payer: Managed Care, Other (non HMO) | Admitting: Family Medicine

## 2023-02-02 DIAGNOSIS — Z91199 Patient's noncompliance with other medical treatment and regimen due to unspecified reason: Secondary | ICD-10-CM | POA: Insufficient documentation

## 2023-02-02 NOTE — Progress Notes (Unsigned)
Patient was not seen for appt d/t no call, no show, or late arrival >10 mins past appt time.   Elise T Payne, FNP  [facility] [address] [place], [place] [address] [phone] (phone) [phone] (fax) [facility]  

## 2023-10-01 ENCOUNTER — Ambulatory Visit: Payer: Self-pay

## 2023-10-01 NOTE — Telephone Encounter (Signed)
 Me     10/01/23  3:45 PM Unsigned Note Copied from CRM #[id number]. Topic: Clinical - Red Word Triage >> Oct 01, 2023  3:39 PM Nyra Capes wrote: Red Word that prompted transfer to Nurse Triage: patient called in wanting to schedule appt. Patient is getting more frustrated, has angry issues that has happening more often. Patient says it might be due to stress at work .     Chief Complaint: anger issues that are interfering with his life and easily frustrated-- refused triage Disposition: [] ED /[] Urgent Care (no appt availability in office) / [x] Appointment(In office/virtual)/ []  [facility]/ [] Home Care/ [] Refused Recommended Disposition /[] [facility]/ []  Follow-up with PCP Additional Notes: Appt for Monday   Reason for Disposition  Requesting regular office appointment  Answer Assessment - Initial Assessment Questions 1. REASON FOR CALL or QUESTION: "What is your reason for calling today?" or "How can I best help you?" or "What question do you have that I can help answer?"     Anger issues and easily frustrated stated he is tired of feeling that way and wants help to make it go away refused triage  Protocols used: Information Only Call - No Triage-A-AH

## 2023-10-01 NOTE — Telephone Encounter (Signed)
 Copied from CRM [id number]. Topic: Clinical - Red Word Triage >> Oct 01, 2023  3:39 PM Nyra Capes wrote: Red Word that prompted transfer to Nurse Triage: patient called in wanting to schedule appt. Patient is getting more frustrated, has angry issues that has happening more often. Patient says it might be due to stress at work . This encounter was created in error - please disregard.

## 2023-10-04 ENCOUNTER — Ambulatory Visit: Admitting: Family Medicine

## 2023-10-04 VITALS — BP 129/89 | HR 117 | Temp 99.9°F | Ht 71.0 in | Wt 256.0 lb

## 2023-10-04 DIAGNOSIS — R454 Irritability and anger: Secondary | ICD-10-CM | POA: Diagnosis not present

## 2023-10-04 DIAGNOSIS — J301 Allergic rhinitis due to pollen: Secondary | ICD-10-CM | POA: Diagnosis not present

## 2023-10-04 MED ORDER — SERTRALINE HCL 50 MG PO TABS: ORAL_TABLET | ORAL | 1 refills | Status: DC

## 2023-10-04 MED ORDER — FLUTICASONE PROPIONATE 50 MCG/ACT NA SUSP: 2.0000 | Freq: Every day | NASAL | 3 refills | Status: AC

## 2023-10-04 NOTE — Progress Notes (Unsigned)
 Established patient visit   Patient: Edward Munoz   DOB: [birth year]   32 y.o. Male  MRN: [id number] Visit Date: 10/04/2023  Today's healthcare provider: Mila Merry, MD   Chief Complaint  Patient presents with   Anger Management    Patient complains this has been an issue for some time.  He states he gets irritated easily.  He states it is getting worse and he feels it is causing problems with his family.  Patient mentioned that he does take Sudafed all the time   Subjective    Discussed the use of AI scribe software for clinical note transcription with the patient, who gave verbal consent to proceed.  History of Present Illness   Edward Munoz is a 33 year old male who presents with anger management issues.  He experiences episodes of extreme anger including 'blacking out from anger' and becoming violent without recollection. He identifies certain triggers, such as interactions with his fiance, who has significant health issues. He reports a history of anger issues since childhood, although he describes his childhood as generally positive with supportive parents. In high school, playing football helped manage his aggression. He wants to control his anger and improve his relationship with his family.  He drinks alcohol once or twice a week, but experience episodes of extreme anger whether or not he has been drinking. 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  He has a history of allergies and has taken Singulair and Zyrtec in the past. Currently, he uses Sudafed for allergy symptoms, which he suspects might contribute to his irritability. He experiences pressure in his ear and eye with pus discharge. He is considering switching back to Zyrtec and using nasal sprays to manage his allergies.  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 He feels that his career has been impacted by his fiance's health issues, which has caused tension in his relationship. He is a smoker, which affects his endurance. Despite his busy work schedule, he enjoys physical activities like running with his children and gardening, which he finds fulfilling.       Medications: Outpatient Medications Prior to Visit  Medication Sig   [DISCONTINUED] omeprazole (PRILOSEC) 20 MG capsule TAKE 1 CAPSULE (20 MG TOTAL) BY MOUTH 2 (TWO) TIMES DAILY BEFORE A MEAL. (Patient not taking: Reported on 08/03/2022)   [DISCONTINUED] simethicone (MYLICON) 125 MG chewable tablet Chew 125 mg by mouth every 6 (six) hours as needed for flatulence.   [DISCONTINUED] sucralfate (CARAFATE) 1 g tablet TAKE 1 TABLET (1 G TOTAL) BY MOUTH 4 TIMES A DAY WITH MEALS AND AT BEDTIME (Patient not taking: Reported on 08/03/2022)   [DISCONTINUED] triamcinolone ointment (KENALOG) 0.5 % Apply 1 Application topically 2 (two) times daily.   No facility-administered medications prior to visit.   Review of Systems {Insert previous labs (optional):23779} {See past labs  Heme  Chem  Endocrine  Serology  Results Review (optional):1}   Objective    BP 129/89 (BP Location: Left Arm, Patient Position: Sitting, Cuff Size: Large)   Pulse (!) 117   Temp 99.9 F (37.7 C) (Oral)   Ht 5\' 11"  (1.803 m)   Wt 256 lb (116.1 kg)   SpO2 100%   BMI 35.70 kg/m {Insert last BP/Wt (optional):23777}{See  vitals history (optional):1}  Physical Exam   General appearance: Mildly obese male, cooperative and in no acute distress Head: Normocephalic, without obvious abnormality, atraumatic Respiratory: Respirations even and unlabored, normal respiratory rate Extremities: All extremities are intact.  Skin: Skin color, texture, turgor normal. No rashes seen  Psych: Appropriate mood and affect. Neurologic: Mental status: Alert, oriented to person, place, and time, thought content  appropriate.    Assessment & Plan        Intermittent Explosive Disorder Episodes of extreme anger not consistently linked to alcohol. Desires control for family.  - Prescribe sertraline, starting at 25 mg, titrate to 100 mg as needed. - Recommend he try to incorporate a regular exercise plan.  - Refer to therapist  - Follow up 4 weeks.   Allergic Rhinitis Symptoms include ear and eye discharge. Sudafed may contribute to irritability. Non-stimulant medications recommended. - Start back Zyrtec. - Prescribe Flonase nasal spray. - Avoid oral decongestants General Health Maintenance Smoking affects endurance and health. Exercise important for stress management. - Encourage regular exercise. - Advise smoking cessation.    No follow-ups on file.      Jeralene Mom, MD  [facility] [phone] (phone) [phone] (fax)  [facility]

## 2023-10-31 ENCOUNTER — Emergency Department
Admission: EM | Admit: 2023-10-31 | Discharge: 2023-11-01 | Disposition: A | Discharge status: Home / Self Care | Attending: Emergency Medicine | Admitting: Emergency Medicine

## 2023-10-31 ENCOUNTER — Emergency Department

## 2023-10-31 DIAGNOSIS — S299XXA Unspecified injury of thorax, initial encounter: Secondary | ICD-10-CM | POA: Insufficient documentation

## 2023-10-31 DIAGNOSIS — Y907 Blood alcohol level of 200-239 mg/100 ml: Secondary | ICD-10-CM | POA: Diagnosis not present

## 2023-10-31 DIAGNOSIS — S2243XA Multiple fractures of ribs, bilateral, initial encounter for closed fracture: Secondary | ICD-10-CM

## 2023-10-31 DIAGNOSIS — S3991XA Unspecified injury of abdomen, initial encounter: Secondary | ICD-10-CM | POA: Insufficient documentation

## 2023-10-31 DIAGNOSIS — S0181XA Laceration without foreign body of other part of head, initial encounter: Secondary | ICD-10-CM | POA: Insufficient documentation

## 2023-10-31 DIAGNOSIS — S0121XA Laceration without foreign body of nose, initial encounter: Secondary | ICD-10-CM | POA: Insufficient documentation

## 2023-10-31 DIAGNOSIS — F10920 Alcohol use, unspecified with intoxication, uncomplicated: Secondary | ICD-10-CM

## 2023-10-31 DIAGNOSIS — Z23 Encounter for immunization: Secondary | ICD-10-CM | POA: Insufficient documentation

## 2023-10-31 DIAGNOSIS — S0990XA Unspecified injury of head, initial encounter: Secondary | ICD-10-CM

## 2023-10-31 DIAGNOSIS — Y92410 Unspecified street and highway as the place of occurrence of the external cause: Secondary | ICD-10-CM | POA: Insufficient documentation

## 2023-10-31 LAB — CBC WITH DIFFERENTIAL/PLATELET
Abs Immature Granulocytes: 0.19 10*3/uL — ABNORMAL HIGH (ref 0.00–0.07)
Basophils Absolute: 0.1 10*3/uL (ref 0.0–0.1)
Basophils Relative: 0 %
Eosinophils Absolute: 0.1 10*3/uL (ref 0.0–0.5)
Eosinophils Relative: 0 %
HCT: 47.7 % (ref 39.0–52.0)
Hemoglobin: 16.6 g/dL (ref 13.0–17.0)
Immature Granulocytes: 1 %
Lymphocytes Relative: 12 %
Lymphs Abs: 2.3 10*3/uL (ref 0.7–4.0)
MCH: 30.9 pg (ref 26.0–34.0)
MCHC: 34.8 g/dL (ref 30.0–36.0)
MCV: 88.8 fL (ref 80.0–100.0)
Monocytes Absolute: 1.5 10*3/uL — ABNORMAL HIGH (ref 0.1–1.0)
Monocytes Relative: 8 %
Neutro Abs: 14.6 10*3/uL — ABNORMAL HIGH (ref 1.7–7.7)
Neutrophils Relative %: 79 %
Platelets: 234 10*3/uL (ref 150–400)
RBC: 5.37 MIL/uL (ref 4.22–5.81)
RDW: 12.5 % (ref 11.5–15.5)
WBC: 18.6 10*3/uL — ABNORMAL HIGH (ref 4.0–10.5)
nRBC: 0 % (ref 0.0–0.2)

## 2023-10-31 LAB — COMPREHENSIVE METABOLIC PANEL WITH GFR
ALT: 65 U/L — ABNORMAL HIGH (ref 0–44)
AST: 54 U/L — ABNORMAL HIGH (ref 15–41)
Albumin: 4.5 g/dL (ref 3.5–5.0)
Alkaline Phosphatase: 60 U/L (ref 38–126)
Anion gap: 12 (ref 5–15)
BUN: 19 mg/dL (ref 6–20)
CO2: 20 mmol/L — ABNORMAL LOW (ref 22–32)
Calcium: 9.1 mg/dL (ref 8.9–10.3)
Chloride: 110 mmol/L (ref 98–111)
Creatinine, Ser: 1.01 mg/dL (ref 0.61–1.24)
GFR, Estimated: >60 mL/min (ref >60)
Glucose, Bld: 95 mg/dL (ref 70–99)
Potassium: 3.5 mmol/L (ref 3.5–5.1)
Sodium: 142 mmol/L (ref 135–145)
Total Bilirubin: 0.9 mg/dL (ref 0.0–1.2)
Total Protein: 7.5 g/dL (ref 6.5–8.1)

## 2023-10-31 LAB — ETHANOL: Alcohol, Ethyl (B): 222 mg/dL — ABNORMAL HIGH (ref <15)

## 2023-10-31 MED ORDER — TETANUS-DIPHTH-ACELL PERTUSSIS 5-2.5-18.5 LF-MCG/0.5 IM SUSY
0.5000 mL | PREFILLED_SYRINGE | Freq: Once | INTRAMUSCULAR | Status: AC
  Administered 2023-11-01: 0.5 mL via INTRAMUSCULAR
  Filled 2023-10-31: qty 0.5

## 2023-10-31 MED ORDER — LIDOCAINE HCL (PF) 1 % IJ SOLN
5.0000 mL | Freq: Once | INTRAMUSCULAR | Status: AC
  Administered 2023-10-31: 5 mL
  Filled 2023-10-31: qty 5

## 2023-10-31 MED ORDER — NICOTINE 14 MG/24HR TD PT24: 14.0000 mg | MEDICATED_PATCH | Freq: Every day | TRANSDERMAL | Status: DC

## 2023-10-31 NOTE — ED Provider Notes (Incomplete)
 11:15 PM  Assumed care at shift change.

## 2023-10-31 NOTE — ED Notes (Addendum)
 Pt is refusing all CT scans. Dr. Synetta Eves notified.

## 2023-10-31 NOTE — ED Notes (Signed)
 Pt tried leaving. This RN explained to that it is not safe for him to leave and gave him a meal tray and apple juice.
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
## 2023-11-01 MED ORDER — IBUPROFEN 800 MG PO TABS: 800.0000 mg | ORAL_TABLET | Freq: Three times a day (TID) | ORAL | 0 refills | Status: AC | PRN

## 2023-11-01 MED ORDER — OXYCODONE HCL 5 MG PO TABS
5.0000 mg | ORAL_TABLET | Freq: Once | ORAL | Status: AC
  Administered 2023-11-01: 5 mg via ORAL
  Filled 2023-11-01: qty 1

## 2023-11-01 MED ORDER — ONDANSETRON 4 MG PO TBDP: 4.0000 mg | ORAL_TABLET | Freq: Four times a day (QID) | ORAL | 0 refills | Status: AC | PRN

## 2023-11-01 MED ORDER — ONDANSETRON 4 MG PO TBDP
4.0000 mg | ORAL_TABLET | Freq: Once | ORAL | Status: AC
  Administered 2023-11-01: 4 mg via ORAL
  Filled 2023-11-01: qty 1

## 2023-11-01 MED ORDER — OXYCODONE HCL 5 MG PO TABS: 5.0000 mg | ORAL_TABLET | Freq: Four times a day (QID) | ORAL | 0 refills | Status: AC | PRN

## 2023-11-01 NOTE — ED Notes (Signed)
 Second attempt made to call [employer]. Awaiting call back at this time.

## 2023-11-01 NOTE — ED Notes (Signed)
 Second meal tray and apple juice given to pt. Pt states he has no one to come pick him up. This RN told pt he could stay in the room until the morning or until he can find a ride.

## 2023-11-01 NOTE — ED Notes (Signed)
 This RN attempted to call [employer]PS. Awaiting a call back at this time.
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
## 2023-11-01 NOTE — ED Notes (Signed)
 Edward Munoz from [employer] called back for information on case.

## 2024-04-07 ENCOUNTER — Other Ambulatory Visit: Payer: Self-pay | Admitting: Family Medicine

## 2024-04-07 DIAGNOSIS — R454 Irritability and anger: Secondary | ICD-10-CM
